# Patient Record
Sex: Male | Born: 1980 | Marital: Married | Smoking: Never smoker
Health system: Southern US, Community
[De-identification: ages and names within clinical notes are randomized; demographics above are authoritative.]

## PROBLEM LIST (undated history)

## (undated) HISTORY — PX: WISDOM TOOTH EXTRACTION: SHX21

---

## 2011-02-28 ENCOUNTER — Ambulatory Visit
Admission: RE | Admit: 2011-02-28 | Discharge: 2011-02-28 | Disposition: A | Discharge status: Home / Self Care | Payer: BC Managed Care – PPO | Source: Ambulatory Visit | Attending: Internal Medicine | Admitting: Internal Medicine

## 2011-02-28 ENCOUNTER — Other Ambulatory Visit: Payer: Self-pay | Admitting: Internal Medicine

## 2011-02-28 DIAGNOSIS — J019 Acute sinusitis, unspecified: Secondary | ICD-10-CM

## 2013-01-04 ENCOUNTER — Emergency Department (HOSPITAL_COMMUNITY)
Admission: EM | Admit: 2013-01-04 | Discharge: 2013-01-04 | Disposition: A | Discharge status: Home / Self Care | Payer: BC Managed Care – PPO | Source: Home / Self Care | Attending: Emergency Medicine | Admitting: Emergency Medicine

## 2013-01-04 ENCOUNTER — Encounter (HOSPITAL_COMMUNITY): Payer: Self-pay

## 2013-01-04 DIAGNOSIS — J019 Acute sinusitis, unspecified: Secondary | ICD-10-CM

## 2013-01-04 DIAGNOSIS — J069 Acute upper respiratory infection, unspecified: Secondary | ICD-10-CM

## 2013-01-04 MED ORDER — IBUPROFEN 800 MG PO TABS: 800.0000 mg | ORAL_TABLET | Freq: Once | ORAL | Status: DC

## 2013-01-04 MED ORDER — HYDROCODONE-ACETAMINOPHEN 5-325 MG PO TABS: 2.0000 | ORAL_TABLET | ORAL | Status: DC | PRN

## 2013-01-04 MED ORDER — AMOXICILLIN 500 MG PO CAPS: 1000.0000 mg | ORAL_CAPSULE | Freq: Three times a day (TID) | ORAL | Status: DC

## 2013-01-04 NOTE — ED Provider Notes (Signed)
Medical screening examination/treatment/procedure(s) were performed by non-physician practitioner and as supervising physician I was immediately available for consultation/collaboration.  Saliou Barnier, M.D.  Aanika Defoor C Deniz Hannan, MD 01/04/13 2041 

## 2013-01-04 NOTE — ED Provider Notes (Signed)
History     CSN: [id number]  Arrival date & time 01/04/13  1113   First MD Initiated Contact with Patient 01/04/13 1116      Chief Complaint  Patient presents with  . Generalized Body Aches    HPI: Patient is a 32 y.o. male presenting with URI. The history is provided by the patient.  URI Presenting symptoms: congestion, facial pain, fatigue, fever, rhinorrhea and sore throat   Presenting symptoms: no cough and no ear pain   Congestion:    Location:  Nasal   Interferes with sleep: yes   Fever:    Duration:  3 days   Timing:  Constant   Temp source:  Subjective   Progression:  Worsening Severity:  Mild Onset quality:  Gradual Duration:  3 days Timing:  Constant Relieved by:  Nothing Ineffective treatments:  None tried Associated symptoms: headaches, myalgias, sinus pain, sneezing and swollen glands   Associated symptoms: no neck pain and no wheezing   Pt reports onset of chills, subjective fever, and sorethroat Thursday. Since he has had severe sinus congestion, frontal area h/a's and body aches. States his nose stays full of secretions that are thick and white. Can not breathe through his nose at night.  History reviewed. No pertinent past medical history.  History reviewed. No pertinent past surgical history.  No family history on file.  History  Substance Use Topics  . Smoking status: Never Smoker   . Smokeless tobacco: Not on file  . Alcohol Use: No      Review of Systems  Constitutional: Positive for fever and fatigue.  HENT: Positive for congestion, sore throat, rhinorrhea, sneezing, postnasal drip and sinus pressure. Negative for ear pain, nosebleeds, facial swelling, neck pain and ear discharge.   Respiratory: Negative for cough and wheezing.   Cardiovascular: Negative for chest pain.  Gastrointestinal: Negative for nausea, vomiting and diarrhea.  Endocrine: Negative.   Genitourinary: Negative.   Musculoskeletal: Positive for myalgias.  Skin:  Negative.   Allergic/Immunologic: Negative.   Neurological: Positive for headaches.  Hematological: Negative.   Psychiatric/Behavioral: Negative.     Allergies  Review of patient's allergies indicates no known allergies.  Home Medications  No current outpatient prescriptions on file.  BP 98/58  Pulse 96  Temp(Src) 101.9 F (38.8 C) (Oral)  Resp 20  SpO2 100%  Physical Exam  Constitutional: He is oriented to person, place, and time. He appears well-developed and well-nourished.  HENT:  Head: Normocephalic and atraumatic.  Right Ear: Tympanic membrane, external ear and ear canal normal.  Left Ear: Tympanic membrane, external ear and ear canal normal.  Nose: Mucosal edema and rhinorrhea present. Right sinus exhibits frontal sinus tenderness. Right sinus exhibits no maxillary sinus tenderness. Left sinus exhibits frontal sinus tenderness. Left sinus exhibits no maxillary sinus tenderness.  Mouth/Throat: Uvula is midline and mucous membranes are normal. Posterior oropharyngeal erythema present. No oropharyngeal exudate, posterior oropharyngeal edema or tonsillar abscesses.  Purulent appearing exudate. Pharyngeal cobblestoning  Neck: Neck supple.  Cardiovascular: Normal rate and regular rhythm.   Pulmonary/Chest: Effort normal and breath sounds normal.  Musculoskeletal: Normal range of motion.  Lymphadenopathy:    He has cervical adenopathy.  Neurological: He is alert and oriented to person, place, and time.  Skin: Skin is warm and dry.    ED Course  Procedures (including critical care time)  Labs Reviewed - No data to display No results found.   No diagnosis found.    MDM  3 day h/o, chills,  fever and sore throat followed by severe sinus congestion, frontal h/a's and body aches. No cough. PE findings c/w sinusitis. Will treat w/ Amoxicillin x 10 days.         Leanne Chang, NP 01/04/13 1241

## 2013-01-04 NOTE — ED Notes (Signed)
C/o body aches and pains, fever, "stuffy nose" and headache. Patient states that the sx started Thursday

## 2014-12-23 ENCOUNTER — Ambulatory Visit: Payer: BC Managed Care – PPO | Attending: Internal Medicine | Admitting: Physical Therapy

## 2014-12-23 DIAGNOSIS — M6289 Other specified disorders of muscle: Secondary | ICD-10-CM

## 2014-12-23 DIAGNOSIS — M797 Fibromyalgia: Secondary | ICD-10-CM | POA: Diagnosis not present

## 2014-12-23 DIAGNOSIS — M7918 Myalgia, other site: Secondary | ICD-10-CM

## 2014-12-23 DIAGNOSIS — M629 Disorder of muscle, unspecified: Secondary | ICD-10-CM | POA: Diagnosis not present

## 2014-12-23 NOTE — Patient Instructions (Signed)
Hamstring: Towel Stretch (Supine)   Lie on back. Loop towel around left foot, hip and knee at 90. Straighten knee and pull foot toward body. Hold ___30 seconds. Relax. Repeat __2-3_ times. Do __2_ times a day. Repeat with other leg. Can also keep knee straight and loop sheet around arch of foot to stretch the entire posterior leg.     Copyright  VHI. All rights reserved.  HIP: Hamstrings - Short Sitting   Rest leg on raised surface. Keep knee straight. Lift chest. Hold __30_ seconds. __2-3_ reps per set, _2__ sets per day, __2-3_ days per week DO as an alternative to supine (on your back) when youre at work  [employer]. All rights reserved.  Outer Hip Stretch: Reclined IT Band Stretch (Strap)   Strap around opposite foot, pull across only as far as possible with shoulders on mat. Hold for _2-3___ breaths. Repeat ___2-3_ times each leg.  Copyright  VHI. All rights reserved.

## 2014-12-24 NOTE — Therapy (Signed)
[place], [place], [address] Phone: [phone]   Fax:  [phone]  Physical Therapy Evaluation  Patient Details  Name: Clinton Davis MRN: [id number] Date of Birth: [birth year] Referring Provider:  Irven Shelling, MD  Encounter Date: 12/23/2014      PT End of Session - 12/24/14 0806    Visit Number 1   Number of Visits 8   Date for PT Re-Evaluation 02/03/15   PT Start Time 1500   PT Stop Time 1555   PT Time Calculation (min) 55 min   Activity Tolerance Patient tolerated treatment well      No past medical history on file.  No past surgical history on file.  There were no vitals taken for this visit.  Visit Diagnosis:  Muscle pain, myofascial  Hamstring tightness of right lower extremity      Subjective Assessment - 12/23/14 1508    Symptoms This patient is an active male, plays soccer/cricket and presents with Rt. post leg pain which began 4 mos ago.   He c/o pain, tightness, cold in limbs at times. He says he does recall a specific injury.  Pain has increased over the last month, drives alot and thinks this contributes to his problem.  He cannot run. WIll play sport this spring.  Denies numbness, foot drop, weakness, low back pain.       Limitations Standing;Walking;Other (comment);Sitting  Sports, driving   How long can you sit comfortably? up to an hour   How long can you stand comfortably? as needed but has an awareness of discomfort with long periods.    How long can you walk comfortably? as needed but has an awareness of discomfort with long periods.    Diagnostic tests none   Patient Stated Goals to be able to play soccer this spring   Currently in Pain? Yes   Pain Score 6   6/10 max, at rest  1/10   Pain Orientation Right;Posterior   Pain Descriptors / Indicators Tightness;Sore   Pain Type Chronic pain   Pain Onset More than a month ago  4 mos    Pain Frequency Several days a  week   Aggravating Factors  driving, increased activity   Pain Relieving Factors changing positions, avoids stretching. Pt says he stretches regularly while in season.    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[place], [employer] educator   Leisure soccer, Dance movement psychotherapist   Overall Cognitive Status Within Functional Limits for tasks assessed   Posture/Postural Control   Posture/Postural Control No significant limitations   AROM   Right Hip Flexion 50  SLR   Left Hip Flexion 60  SLR   Lumbar Flexion WNL  with knees bent   Lumbar Extension WNL   Lumbar - Right Side Bend WNL   Lumbar - Left Side Bend WNL   Lumbar - Right Rotation WNL   Lumbar - Left Rotation WNL   PROM   Left Hip Flexion 60  Strength   Right Hip Flexion 5/5   Right Hip Extension 4+/5  pain knee post   Right Hip ABduction 5/5  glute med 4/5   Left Hip Flexion 5/5   Left Hip Extension 5/5   Left Hip ABduction 5/5   Right Knee Flexion 4+/5   Right Knee Extension 5/5   Left Knee Flexion 5/5   Left Knee Extension 5/5   Right Hip   Right Hip Flexion 60  pain   Palpation   Palpation sore to palpation length of Rt. medial hamstring, mostly in muscle belly                  [facility] Adult PT Treatment/Exercise - 12/23/14 1513    Knee/Hip Exercises: Stretches   Active Hamstring Stretch 3 reps;30 seconds   ITB Stretch 3 reps;30 seconds   Cryotherapy   Number Minutes Cryotherapy 12 Minutes   Cryotherapy Location --  hamstring   Type of Cryotherapy Ice pack   Electrical Stimulation   Electrical Stimulation Location Rt. hamstring   Electrical  Stimulation Action IFC   Electrical Stimulation Parameters to tolerance   Electrical Stimulation Goals Pain                PT Education - 12/24/14 [phone]    Education provided Yes   Education Details PT/POC, HEP for stretching, strain vs tear in mm, diff diagnosis   Person(s) Educated Patient   Methods Explanation;Demonstration;Handout   Comprehension Verbalized understanding;Returned demonstration             PT Long Term Goals - 12/24/14 2595    PT LONG TERM GOAL #1   Title Pt will be I with HEP for stretching and core strength   Time 6   Period Weeks   Status New   PT LONG TERM GOAL #2   Title Pt will be able to jog without increased pain    Time 6   Period Weeks   Status New   PT LONG TERM GOAL #3   Title Pt wil be able to sit for 1 hour to drive with no more than min pain    Time 6   Period Weeks   Status New   PT LONG TERM GOAL #4   Title Pt will be able to stand/walk and report no increased pain    Time 6   Period Weeks   Status New               Plan - 12/24/14 0807    Clinical Impression Statement This patient likely has hamstring strain that was not addressed when it initially happened.  He does have increased muscle tension, mild inhibition of strength due to pain.  He will benefit from gentle stretching, education and modailities to provide pain relief for patient to resume sports this spring.    Pt will benefit from skilled therapeutic intervention in order to improve on the following deficits Impaired flexibility;Increased fascial restricitons;Pain;Decreased strength;Decreased mobility   Rehab Potential Excellent   PT Frequency 2x / week  may only be able to do 1 time per week due to busy work schedule   PT Duration 6 weeks  total of 8 visits   PT Treatment/Interventions Moist Heat;Therapeutic activities;Patient/family education;Passive range of motion;Therapeutic exercise;Ultrasound;Manual techniques;Dry needling;Neuromuscular  re-education;Cryotherapy;Electrical Stimulation;Other (comment)  iontophoresis   PT Next Visit Plan stretch, manual, [place] vs IFC   PT Home Exercise Plan hamstring and ITB stretch   Consulted and Agree with Plan of Care  Patient         Problem List There are no active problems to display for this patient.   PAA,JENNIFER 12/24/2014, 8:31 AM  [facility] [address] [place], [place], [address] Phone: [phone]   Fax:  [phone]

## 2015-01-05 ENCOUNTER — Ambulatory Visit: Payer: BC Managed Care – PPO | Admitting: Physical Therapy

## 2015-01-05 DIAGNOSIS — M7918 Myalgia, other site: Secondary | ICD-10-CM

## 2015-01-05 DIAGNOSIS — M797 Fibromyalgia: Secondary | ICD-10-CM | POA: Diagnosis not present

## 2015-01-05 NOTE — Patient Instructions (Signed)
Warm up before stretching

## 2015-01-05 NOTE — Therapy (Signed)
[place], [place], [address] Phone: [phone]   Fax:  [phone]  Physical Therapy Treatment  Patient Details  Name: Clinton Davis MRN: [id number] Date of Birth: [birth year] Referring Provider:  Lavone Orn, MD  Encounter Date: 01/05/2015      PT End of Session - 01/05/15 0823    Visit Number 2   Number of Visits 8   Date for PT Re-Evaluation 02/03/15   PT Start Time 0735   PT Stop Time 0820   PT Time Calculation (min) 45 min   Activity Tolerance Patient tolerated treatment well      No past medical history on file.  No past surgical history on file.  There were no vitals filed for this visit.  Visit Diagnosis:  Muscle pain, myofascial      Subjective Assessment - 01/05/15 0822    Symptoms Tried to run and it hurt.  Needs another copy of his exercises.  Rt thigh posterior Gluteal to knee, lateral>Medial.  Also reported Rt scapular problem with turning head to LT.  Not really pain.                       [facility] Adult PT Treatment/Exercise - 01/05/15 0735    Knee/Hip Exercises: Stretches   Passive Hamstring Stretch --  re -issued   ITB Stretch --  reissued written instructions.  not practiced   Acupuncturist Location Rt hamstring   Electrical Stimulation Action IFC   Electrical Stimulation Parameters 12   Electrical Stimulation Goals Pain   Ultrasound   Ultrasound Location --  Posterior thigh RT proximal/distal   Ultrasound Parameters 100%, 1.5 watts/cm2   Ultrasound Goals Pain   Manual Therapy   Manual Therapy --  soft tissue work. tender distal , proximal central. No lumps                     PT Long Term Goals - 01/05/15 0827    PT LONG TERM GOAL #1   Title Pt will be I with HEP for stretching and core strength   Time 6   Period Weeks   Status On-going   PT LONG TERM GOAL #2   Title Pt will be able to jog without  increased pain    Time 6   Period Weeks   Status On-going   PT LONG TERM GOAL #3   Title Pt wil be able to sit for 1 hour to drive with no more than min pain    Time 6   Period Weeks   Status On-going   PT LONG TERM GOAL #4   Title Pt will be able to stand/walk and report no increased pain    Time 6   Period Weeks   Status On-going               Plan - 01/05/15 6546    Clinical Impression Statement  Could not find any muscle deviations with manual. Tension notes through out.  No new goals met.  Patient wanted IFC "It helped 2 days"   PT Next Visit Plan Stretch         Problem List There are no active problems to display for this patient. Melvenia Needles, PTA 01/05/2015 8:29 AM Phone: [phone] Fax: [phone]   [facility] 01/05/2015, 8:29 AM  [facility] [address] [place], [place], [address] Phone: [phone]   Fax:  [phone]      

## 2015-01-13 ENCOUNTER — Ambulatory Visit: Payer: BC Managed Care – PPO

## 2015-01-13 DIAGNOSIS — M6289 Other specified disorders of muscle: Secondary | ICD-10-CM

## 2015-01-13 DIAGNOSIS — M797 Fibromyalgia: Secondary | ICD-10-CM | POA: Diagnosis not present

## 2015-01-13 DIAGNOSIS — M7918 Myalgia, other site: Secondary | ICD-10-CM

## 2015-01-13 NOTE — Patient Instructions (Signed)
Asked to do partial sit up and hold with active SLR 3x5 reps 1-2x/day along with passive hamstring stretch

## 2015-01-13 NOTE — Therapy (Signed)
[place], [place], [address] Phone: [phone]   Fax:  [phone]  Physical Therapy Treatment  Patient Details  Name: Clinton Davis MRN: [id number] Date of Birth: [birth year] Referring Provider:  Lavone Orn, MD  Encounter Date: 01/13/2015      PT End of Session - 01/13/15 0753    Visit Number 3   Date for PT Re-Evaluation 02/03/15   PT Start Time 0705   PT Stop Time 0810   PT Time Calculation (min) 65 min   Activity Tolerance Patient tolerated treatment well   Behavior During Therapy [facility] for tasks assessed/performed      No past medical history on file.  No past surgical history on file.  There were no vitals filed for this visit.  Visit Diagnosis:  Hamstring tightness of right lower extremity  Muscle pain, myofascial      Subjective Assessment - 01/13/15 0710    Symptoms Annoying feeling (less like pain) in RT lateral hamstring from buttock to calf.  Stretches 1x/day 2x 30 sec.  Better by 40%.   He is able to drive more with less pain shifting.     Currently in Pain? Yes   Pain Score 4    Multiple Pain Sites No                       [facility] Adult PT Treatment/Exercise - 01/13/15 0715    Exercises   Exercises Knee/Hip   Knee/Hip Exercises: Stretches   Active Hamstring Stretch 3 reps;5 reps  1 set with pull down 1 set with head shoulder raise   Active Hamstring Stretch Limitations done with pull down and with head/shoulder and arm raise    Passive Hamstring Stretch 3 reps;30 seconds  REview of HEP   Knee/Hip Exercises: Aerobic   Stationary Bike L3 5 min   Manual Therapy   Manual Therapy Other (comment)   Other Manual Therapy MET for LT on Lt sacrum and anterior rotated RT ilia     RT active hamstring length improve 10 degrees after MET           PT Education - 01/13/15 0753    Education provided Yes   Education Details Active hamstring stretch   Person(s)  Educated Patient   Methods Explanation;Demonstration;Verbal cues   Comprehension Returned demonstration;Verbalized understanding             PT Long Term Goals - 01/05/15 0827    PT LONG TERM GOAL #1   Title Pt will be I with HEP for stretching and core strength   Time 6   Period Weeks   Status On-going   PT LONG TERM GOAL #2   Title Pt will be able to jog without increased pain    Time 6   Period Weeks   Status On-going   PT LONG TERM GOAL #3   Title Pt wil be able to sit for 1 hour to drive with no more than min pain    Time 6   Period Weeks   Status On-going   PT LONG TERM GOAL #4   Title Pt will be able to stand/walk and report no increased pain    Time 6   Period Weeks   Status On-going               Plan - 01/13/15 0754    Clinical Impression Statement Asymetry in pelvis may contribute to hamstring issues. Will assess pelvis  again and do MET if needed and how he felt post this sessioon.    PT Next Visit Plan Stretch, MET possible , modalities   Consulted and Agree with Plan of Care Patient        Problem List There are no active problems to display for this patient.   Darrel Hoover PT 01/13/2015, 7:56 AM  [facility] [address] [place], [place], [address] Phone: [phone]   Fax:  [phone]

## 2015-01-18 ENCOUNTER — Ambulatory Visit: Payer: BC Managed Care – PPO

## 2015-01-18 DIAGNOSIS — M797 Fibromyalgia: Secondary | ICD-10-CM | POA: Diagnosis not present

## 2015-01-18 DIAGNOSIS — M6289 Other specified disorders of muscle: Secondary | ICD-10-CM

## 2015-01-18 DIAGNOSIS — M7918 Myalgia, other site: Secondary | ICD-10-CM

## 2015-01-18 NOTE — Therapy (Signed)
[place], [place], [address] Phone: [phone]   Fax:  [phone]  Physical Therapy Treatment  Patient Details  Name: Clinton Davis MRN: [id number] Date of Birth: May 15, 1981 Referring Provider:  Lavone Orn, MD  Encounter Date: 01/18/2015      PT End of Session - 01/18/15 0755    Visit Number 4   Number of Visits 8   Date for PT Re-Evaluation 02/03/15   PT Start Time 0700   PT Stop Time 0805   PT Time Calculation (min) 65 min   Activity Tolerance Patient tolerated treatment well;Patient limited by pain   Behavior During Therapy [facility] for tasks assessed/performed      No past medical history on file.  No past surgical history on file.  There were no vitals filed for this visit.  Visit Diagnosis:  Hamstring tightness of right lower extremity  Muscle pain, myofascial      Subjective Assessment - 01/18/15 0718    Symptoms Posterior RT knee pain today. Less pain in thigh and close to buttock.   Currently in Pain? Yes   Pain Score 6    Pain Location Knee   Pain Orientation Right;Posterior   Pain Descriptors / Indicators Aching   Multiple Pain Sites No            [facility] PT Assessment - 01/18/15 0721    AROM   Right/Left Hip --  SLR RT 2 inches > Lt    Palpation   Palpation all pelvic landmarks and leg length appear level,                    [facility] Adult PT Treatment/Exercise - 01/18/15 0719    Knee/Hip Exercises: Stretches   Active Hamstring Stretch 3 reps;5 reps   Active Hamstring Stretch Limitations done with pull down and with head/shoulder and arm raise . Also done sitting with opposite leg leg lift 3 x 5 reps with decreased throb in RT posterior thigh (throb was from SLR passivex 1)    Knee/Hip Exercises: Aerobic   Stationary Bike L3 8 min   Knee/Hip Exercises: Prone   Hamstring Curl 2 sets;10 reps   Hamstring Curl Limitations one set no weight , one set 3 pounds    Straight Leg Raises Strengthening;Right;1 set;15 reps   Straight Leg Raises Limitations 3 pounds    Modalities   Modalities Moist Heat   Moist Heat Therapy   Number Minutes Moist Heat 20 Minutes   Moist Heat Location --  posterior RT thigh   Electrical Stimulation   Electrical Stimulation Location Rt hamstring   Electrical Stimulation Action IFC   Electrical Stimulation Parameters L12   Electrical Stimulation Goals Pain      Prone press ups and full lumbar flexion did not change symptoms today          PT Education - 01/18/15 0755    Education provided Yes   Education Details reviewed what was asssessed today andPOC   Person(s) Educated Patient   Methods Explanation   Comprehension Verbalized understanding             PT Long Term Goals - 01/05/15 0827    PT LONG TERM GOAL #1   Title Pt will be I with HEP for stretching and core strength   Time 6   Period Weeks   Status On-going   PT LONG TERM GOAL #2   Title Pt will be able to jog without increased  pain    Time 6   Period Weeks   Status On-going   PT LONG TERM GOAL #3   Title Pt wil be able to sit for 1 hour to drive with no more than min pain    Time 6   Period Weeks   Status On-going   PT LONG TERM GOAL #4   Title Pt will be able to stand/walk and report no increased pain    Time 6   Period Weeks   Status On-going               Plan - 01/18/15 0755    Clinical Impression Statement No asymetry  now and no back motions increased or decreaseed pain. Will start strength and will start manual to postreior RT thigh with kineseotape STW and modalities   PT Next Visit Plan Assess pelvis , ham curls and hip extension strength, SLR with LT leg sitting , Posssible total motion assesment. STW modalities   Consulted and Agree with Plan of Care Patient        Problem List There are no active problems to display for this patient.   Darrel Hoover PT 01/18/2015, 7:59 AM  [facility] [address] [place], [place], [address] Phone: [phone]   Fax:  [phone]

## 2015-01-27 ENCOUNTER — Ambulatory Visit: Payer: BC Managed Care – PPO | Attending: Internal Medicine

## 2015-01-27 DIAGNOSIS — M6289 Other specified disorders of muscle: Secondary | ICD-10-CM

## 2015-01-27 DIAGNOSIS — M797 Fibromyalgia: Secondary | ICD-10-CM | POA: Diagnosis not present

## 2015-01-27 DIAGNOSIS — M7918 Myalgia, other site: Secondary | ICD-10-CM

## 2015-01-27 DIAGNOSIS — M629 Disorder of muscle, unspecified: Secondary | ICD-10-CM | POA: Diagnosis not present

## 2015-01-27 NOTE — Therapy (Signed)
[place], [place], [address] Phone: [phone]   Fax:  [phone]  Physical Therapy Treatment  Patient Details  Name: Theodus Ran MRN: [id number] Date of Birth: June 06, 1981 Referring Provider:  Lavone Orn, MD  Encounter Date: 01/27/2015      PT End of Session - 01/27/15 0747    PT Start Time 0710   PT Stop Time 0810   PT Time Calculation (min) 60 min   Activity Tolerance Patient tolerated treatment well   Behavior During Therapy [facility] for tasks assessed/performed      No past medical history on file.  No past surgical history on file.  There were no vitals filed for this visit.  Visit Diagnosis:  Hamstring tightness of right lower extremity - Plan: PT plan of care cert/re-cert  Muscle pain, myofascial - Plan: PT plan of care cert/re-cert      Subjective Assessment - 01/27/15 0711    Subjective 4/10 posterior knee pain.  Ran across road with pain but less than before.    Currently in Pain? Yes   Pain Score 4    Multiple Pain Sites No                       [facility] Adult PT Treatment/Exercise - 01/27/15 0713    Knee/Hip Exercises: Stretches   Active Hamstring Stretch 3 reps;5 reps   Active Hamstring Stretch Limitations done with pull down and with head/shoulder and arm raise . Also done sitting with opposite leg leg lift 3 x 5 reps with decreased throb in RT posterior thigh (throb was from SLR passivex 1)    Passive Hamstring Stretch 3 reps;30 seconds   Knee/Hip Exercises: Aerobic   Stationary Bike L# 6 min   Knee/Hip Exercises: Prone   Hamstring Curl 10 reps;2 sets  60 sec stretch after curl   Hamstring Curl Limitations 5 pounds   Moist Heat Therapy   Number Minutes Moist Heat 20 Minutes   Moist Heat Location --  hamstring   Electrical Stimulation   Electrical Stimulation Location Rt hamstring   Electrical Stimulation Action IFC   Electrical Stimulation Parameters L18   Electrical Stimulation Goals Pain   Ultrasound   Ultrasound Location RT posterior knee /hamstring medial   Ultrasound Parameters 100% 1MHz, 1.6Wcm2   Ultrasound Goals Pain                     PT Long Term Goals - 01/27/15 0753    PT LONG TERM GOAL #1   Title Pt will be I with HEP for stretching and core strength   Status On-going   PT LONG TERM GOAL #2   Title Pt will be able to jog without increased pain    Status On-going   PT LONG TERM GOAL #3   Title Pt wil be able to sit for 1 hour to drive with no more than min pain    Status On-going   PT LONG TERM GOAL #4   Title Pt will be able to stand/walk and report no increased pain    Status On-going               Plan - 01/27/15 0748    Clinical Impression Statement PAin level less . Still 5-10 degrees decr SLR on RT, Slow progress. Have asked to have him schedled with other PT for dry needling   PT Next Visit Plan Continue hamstring curls. strething, modalities , ?  STW,    Consulted and Agree with Plan of Care Patient        Problem List There are no active problems to display for this patient.   Darrel Hoover PT 01/27/2015, 8:05 AM  [facility] [address] [place], [place], [address] Phone: [phone]   Fax:  [phone]

## 2015-02-02 ENCOUNTER — Ambulatory Visit: Payer: BC Managed Care – PPO | Admitting: Physical Therapy

## 2015-02-02 DIAGNOSIS — M797 Fibromyalgia: Secondary | ICD-10-CM | POA: Diagnosis not present

## 2015-02-02 DIAGNOSIS — M7918 Myalgia, other site: Secondary | ICD-10-CM

## 2015-02-02 DIAGNOSIS — M6289 Other specified disorders of muscle: Secondary | ICD-10-CM

## 2015-02-02 NOTE — Therapy (Signed)
[place], [place], [address] Phone: [phone]   Fax:  [phone]  Physical Therapy Treatment  Patient Details  Name: Clinton Davis MRN: [id number] Date of Birth: 12/02/1980 Referring Provider:  Lavone Orn, MD  Encounter Date: 02/02/2015      PT End of Session - 02/02/15 0735    Visit Number 6   Number of Visits 16   Date for PT Re-Evaluation 02/24/15   PT Start Time 0730   PT Stop Time 0800   PT Time Calculation (min) 30 min      No past medical history on file.  No past surgical history on file.  There were no vitals filed for this visit.  Visit Diagnosis:  Hamstring tightness of right lower extremity  Muscle pain, myofascial      Subjective Assessment - 02/02/15 0840    Subjective It's still there   Currently in Pain? Yes   Pain Score 3    Pain Location Knee   Pain Orientation Right;Posterior   Aggravating Factors  running   Pain Relieving Factors modalities                       [facility] Adult PT Treatment/Exercise - 02/02/15 0001    Knee/Hip Exercises: Aerobic   Stationary Bike L3 x 5 min   Ultrasound   Ultrasound Location Rt posterior knee and right proximal medial hamstring   Ultrasound Parameters 100% 1.3 w/cm2   Ultrasound Goals Pain   Manual Therapy   Manual Therapy Massage   Massage STW proximal to distal hamstring.                      PT Long Term Goals - 01/27/15 0753    PT LONG TERM GOAL #1   Title Pt will be I with HEP for stretching and core strength   Status On-going   PT LONG TERM GOAL #2   Title Pt will be able to jog without increased pain    Status On-going   PT LONG TERM GOAL #3   Title Pt wil be able to sit for 1 hour to drive with no more than min pain    Status On-going   PT LONG TERM GOAL #4   Title Pt will be able to stand/walk and report no increased pain    Status On-going               Plan - 02/02/15  0847    Clinical Impression Statement Pt reports frustration with not returning to [employer] and contiued pain in hamstring after attempts at running. Primary PT recommends dry needling trial. No change in pain after manual and ultrasound today.    PT Next Visit Plan Continue hamstring curls. strething, modalities , Dry needing        Problem List There are no active problems to display for this patient.   Dorene Ar, [place] 02/02/2015, 8:49 AM  [place], [place], [address] Phone: [phone]   Fax:  [phone]

## 2015-02-08 ENCOUNTER — Ambulatory Visit: Payer: BC Managed Care – PPO | Admitting: Physical Therapy

## 2015-02-08 DIAGNOSIS — M6289 Other specified disorders of muscle: Secondary | ICD-10-CM

## 2015-02-08 DIAGNOSIS — M7918 Myalgia, other site: Secondary | ICD-10-CM

## 2015-02-08 DIAGNOSIS — M797 Fibromyalgia: Secondary | ICD-10-CM | POA: Diagnosis not present

## 2015-02-08 NOTE — Therapy (Signed)
[place], [place], [address] Phone: [phone]   Fax:  [phone]  Physical Therapy Treatment  Patient Details  Name: Clinton Davis MRN: [id number] Date of Birth: [birth year] Referring Provider:  Lavone Orn, MD  Encounter Date: 02/08/2015      PT End of Session - 02/08/15 1656    Visit Number 7   Number of Visits 16   Date for PT Re-Evaluation 02/24/15   PT Start Time 5462   PT Stop Time 1638   PT Time Calculation (min) 53 min   Activity Tolerance Patient tolerated treatment well      No past medical history on file.  No past surgical history on file.  There were no vitals filed for this visit.  Visit Diagnosis:  Hamstring tightness of right lower extremity  Muscle pain, myofascial      Subjective Assessment - 02/08/15 1537    Subjective (p) Much improved, the massage, the U/S helped, e-stim helped but I still feel it;  ran a little not far but it is better than a few weeks;  no 1 mile yet;     Currently in Pain? (p) No/denies   Pain Orientation (p) Right;Posterior;Mid;Distal   Pain Type (p) Chronic pain   Aggravating Factors  (p) running; sitting too long                         [facility] Adult PT Treatment/Exercise - 02/08/15 1624    Exercises   Exercises Knee/Hip   Knee/Hip Exercises: Seated   Other Seated Knee Exercises Level 1 agility and core strengthening progressing see patient instructions   Moist Heat Therapy   Number Minutes Moist Heat 12 Minutes   Moist Heat Location --  right posterior thigh (prone)   Manual Therapy   Manual Therapy Myofascial release   Myofascial Release right HS          Trigger Point Dry Needling - 02/08/15 1626    Muscles Treated Lower Body Hamstring   Hamstring Response Twitch response elicited;Palpable increased muscle length  biceps and semitendinosis       Right only.       PT Education - 02/08/15 1655    Education  provided Yes   Education Details dry needling handout; HS agility and core strengthening   Person(s) Educated Patient   Methods Explanation;Demonstration;Handout   Comprehension Verbalized understanding;Returned demonstration             PT Long Term Goals - 02/08/15 1658    PT LONG TERM GOAL #1   Title Pt will be I with HEP for stretching and core strength   Time 6   Period Weeks   Status On-going   PT LONG TERM GOAL #2   Title Pt will be able to jog without increased pain    Time 6   Period Weeks   Status On-going   PT LONG TERM GOAL #3   Title Pt wil be able to sit for 1 hour to drive with no more than min pain    Time 6   Period Weeks   Status On-going   PT LONG TERM GOAL #4   Title Pt will be able to stand/walk and report no increased pain    Time 6   Period Weeks   Status On-going               Plan - 02/08/15 1704    Clinical Impression  Statement Patient with multiple trigger points right biceps femoris and semitendinosis muscles.  Patient had multiple muscle twitches with dry needling and improved muscle length following manual therapy.  Therapist closely monitoring response throughout.  Patient receptive to dynamic HS agility and core strengthening HEP.     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   Alvera Singh 02/08/2015, 5:10 PM  [facility] [address] [place], [place], [address] Phone: [phone]   Fax:  [phone]   Ruben Im, PT 02/08/2015 5:10 PM Phone: [phone] Fax: [phone]

## 2015-02-08 NOTE — Patient Instructions (Signed)
HEP:  Sidestepping 3x 12min; grapevine 3x 1 min; planks 3x 20 sec; side planks 3x 20 sec; bridging; long sitting with cold pack; Use of heat following dry needling  Trigger Point Dry Needling  . What is Trigger Point Dry Needling (DN)? o DN is a physical therapy technique used to treat muscle pain and dysfunction. Specifically, DN helps deactivate muscle trigger points (muscle knots).  o A thin filiform needle is used to penetrate the skin and stimulate the underlying trigger point. The goal is for a local twitch response (LTR) to occur and for the trigger point to relax. No medication of any kind is injected during the procedure.   . What Does Trigger Point Dry Needling Feel Like?  o The procedure feels different for each individual patient. Some patients report that they do not actually feel the needle enter the skin and overall the process is not painful. Very mild bleeding may occur. However, many patients feel a deep cramping in the muscle in which the needle was inserted. This is the local twitch response.   Marland Kitchen How Will I feel after the treatment? o Soreness is normal, and the onset of soreness may not occur for a few hours. Typically this soreness does not last longer than two days.  o Bruising is uncommon, however; ice can be used to decrease any possible bruising.  o In rare cases feeling tired or nauseous after the treatment is normal. In addition, your symptoms may get worse before they get better, this period will typically not last longer than 24 hours.   . What Can I do After My Treatment? o Increase your hydration by drinking more water for the next 24 hours. o You may place ice or heat on the areas treated that have become sore, however, do not use heat on inflamed or bruised areas. Heat often brings more relief post needling. o You can continue your regular activities, but vigorous activity is not recommended initially after the treatment for 24 hours. o DN is best combined with  other physical therapy such as strengthening, stretching, and other therapies.

## 2015-02-15 ENCOUNTER — Ambulatory Visit: Payer: BC Managed Care – PPO | Admitting: Physical Therapy

## 2015-02-25 ENCOUNTER — Ambulatory Visit: Payer: BC Managed Care – PPO | Attending: Internal Medicine | Admitting: Physical Therapy

## 2015-02-25 DIAGNOSIS — M797 Fibromyalgia: Secondary | ICD-10-CM | POA: Diagnosis not present

## 2015-02-25 DIAGNOSIS — M7918 Myalgia, other site: Secondary | ICD-10-CM

## 2015-02-25 DIAGNOSIS — M629 Disorder of muscle, unspecified: Secondary | ICD-10-CM | POA: Insufficient documentation

## 2015-02-25 DIAGNOSIS — M6289 Other specified disorders of muscle: Secondary | ICD-10-CM

## 2015-02-25 NOTE — Therapy (Addendum)
[place], [place], [address] Phone: [phone]   Fax:  [phone]  Physical Therapy Treatment/Recertification/Discharge Summary  Patient Details  Name: Clinton Davis MRN: [id number] Date of Birth: October 02, 1981 Referring Provider:  Lavone Orn, MD  Encounter Date: 02/25/2015      PT End of Session - 02/25/15 1135    Visit Number 8   Number of Visits 16   Date for PT Re-Evaluation 03/25/15   PT Start Time 1025   PT Stop Time 1130   PT Time Calculation (min) 65 min   Activity Tolerance Patient tolerated treatment well      No past medical history on file.  No past surgical history on file.  There were no vitals filed for this visit.  Visit Diagnosis:  Hamstring tightness of right lower extremity - Plan: PT plan of care cert/re-cert  Muscle pain, myofascial - Plan: PT plan of care cert/re-cert      Subjective Assessment - 02/25/15 1025    Subjective Patient states he plans to see the doctor regarding imaging.  Patient states I still feel it but did do a few jogs.  Pulsed a lot when I sat down afterwards.  Reports minimal compliance with exercise  because of busy schedule.  Unsure of the benefit of dry needling.     Currently in Pain? No/denies   Pain Location Leg   Pain Orientation Right;Posterior   Pain Type Chronic pain   Aggravating Factors  driving with right leg extended            [facility] PT Assessment - 02/25/15 1122    ROM / Strength   AROM / PROM / Strength AROM;PROM   AROM   Right/Left Hip Right   Right Hip Extension 10   Strength   Right Hip Flexion 5/5   Right Hip Extension 5/5   Right Hip ABduction 4+/5                     [facility] Adult PT Treatment/Exercise - 02/25/15 1131    Knee/Hip Exercises: Supine   Other Supine Knee Exercises hip flexor stretch over side of table 3x 20 sec   Other Supine Knee Exercises verbal instruction of gluteus medius "best  exercises"   Knee/Hip Exercises: Sidelying   Other Sidelying Knee Exercises right hip abduction 10x   Moist Heat Therapy   Number Minutes Moist Heat 12 Minutes   Moist Heat Location --  right HS in prone   Manual Therapy   Manual Therapy Myofascial release;Joint mobilization   Joint Mobilization Hip mobs grade 3/4 distraction, inferior, A-P in IR,PA in ER and IR 3x 20 sec   Massage STW proximal to distal hamstring.    Myofascial Release instrument assisted Soft tissue mob HS   Other Manual Therapy sciatic neural glides 20x          Trigger Point Dry Needling - 02/25/15 1134    Consent Given? Yes   Muscles Treated Lower Body Hamstring   Hamstring Response Twitch response elicited;Palpable increased muscle length              PT Education - 02/25/15 1121    Education provided Yes   Education Details hip flexor stretching; gluteal med strengthening handout; gave blue band; self care following needling   Person(s) Educated Patient   Methods Explanation;Demonstration   Comprehension Verbalized understanding;Returned demonstration             PT Long Term Goals -  02/25/15 1030    PT LONG TERM GOAL #1   Title (p) Pt will be I with HEP for stretching and core strength   Time (p) 6   Period (p) Weeks   Status (p) On-going   PT LONG TERM GOAL #2   Title (p) Pt will be able to jog without increased pain    Time (p) 6   Status (p) Partially Met   PT LONG TERM GOAL #3   Title (p) Pt wil be able to sit for 1 hour to drive with no more than min pain    Time (p) 6   Period (p) Weeks   Status (p) Achieved   PT LONG TERM GOAL #4   Title (p) Pt will be able to stand/walk and report no increased pain    Status (p) Achieved               Plan - 02/25/15 1135    Clinical Impression Statement Patient self reports minimal compliance with HEP.  He reports he is better but still with HS pain following attempts to jog and with driving for 3 hours.  He reports no major  changes following 1 session of dry needling but lack of compliance with home exercise over the past 2 1/2 weeks may have contributed to lack of benefit.  Decreased hip flexor muscle length right > left.  HS length on right 45 degrees.   Decreased right hip abd strength 4+/5.  2nd time dry needling today and patient was instructed in additional exercises to address glut med weakness and hip flexor length.  He would like to hold PT to discuss with his doctor his current status.  Patient agrees that if he has not called to schedule additional appts by the end of the month will discharge from PT.   PT Next Visit Plan Patient did not wish to schedule appts at this time.  Will recertify for 4 weeks.         PHYSICAL THERAPY DISCHARGE SUMMARY  Visits from Start of Care: 8  Current functional level related to goals / functional outcomes: See clinical impressions above .  He did not call/return after last appointment and his chart has been inactive for several months.     Remaining deficits: Partial goals met   Education / Equipment: HEP Plan: Patient agrees to discharge.  Patient goals were partially met. Patient is being discharged due to not returning since the last visit.  ?????        Problem List There are no active problems to display for this patient.  Ruben Im, PT 07/21/2015 8:19 AM Phone: [phone] Fax: [phone]  Alvera Singh 02/25/2015, 11:49 AM  [place], [place], [address] Phone: [phone]   Fax:  [phone]

## 2017-07-10 ENCOUNTER — Other Ambulatory Visit (HOSPITAL_BASED_OUTPATIENT_CLINIC_OR_DEPARTMENT_OTHER): Payer: Self-pay

## 2017-07-10 DIAGNOSIS — G4730 Sleep apnea, unspecified: Secondary | ICD-10-CM

## 2017-07-10 DIAGNOSIS — G4710 Hypersomnia, unspecified: Secondary | ICD-10-CM

## 2017-07-13 ENCOUNTER — Ambulatory Visit (HOSPITAL_COMMUNITY)
Admission: EM | Admit: 2017-07-13 | Discharge: 2017-07-13 | Disposition: A | Discharge status: Home / Self Care | Payer: BC Managed Care – PPO | Attending: Family Medicine | Admitting: Family Medicine

## 2017-07-13 ENCOUNTER — Encounter (HOSPITAL_COMMUNITY): Payer: Self-pay | Admitting: Family Medicine

## 2017-07-13 DIAGNOSIS — T161XXA Foreign body in right ear, initial encounter: Secondary | ICD-10-CM

## 2017-07-13 NOTE — ED Triage Notes (Signed)
Pt here for q tip end in right ear and right upper back pain.

## 2017-07-15 NOTE — ED Provider Notes (Signed)
  [place]   [id number] 07/13/17 Arrival Time: 2482  ASSESSMENT & PLAN:  1. Foreign body of right ear, initial encounter    Cotton removed without complication using alligator forceps. Reviewed expectations re: course of current medical issues. Questions answered. Outlined signs and symptoms indicating need for more acute intervention. Patient verbalized understanding. After Visit Summary given.   SUBJECTIVE:  Micai Apolinar is a 36 y.o. male who presents with complaint of foreign body in his R ear. Cotton from Q-tip stuck. No pain. Happened today. Unable to remove.  ROS: As per HPI.   OBJECTIVE:  Vitals:   07/13/17 1807  BP: 105/64  Pulse: 64  Resp: 18  Temp: 98.1 F (36.7 C)  SpO2: 99%    General appearance: alert; no distress HENT: R EAC with cotton present; TM normal Neck: supple Psychological: alert and cooperative; normal mood and affect  No Known Allergies   Social History   Social History  . Marital status: Married    Spouse name: N/A  . Number of children: N/A  . Years of education: N/A   Occupational History  . Not on file.   Social History Main Topics  . Smoking status: Never Smoker  . Smokeless tobacco: Not on file  . Alcohol use No  . Drug use: No  . Sexual activity: Not on file   Other Topics Concern  . Not on file   Social History Narrative  . No narrative on file      Vanessa Kick, MD 07/15/17 [phone]

## 2017-08-02 ENCOUNTER — Ambulatory Visit (HOSPITAL_BASED_OUTPATIENT_CLINIC_OR_DEPARTMENT_OTHER): Payer: BC Managed Care – PPO | Attending: Internal Medicine | Admitting: Internal Medicine

## 2017-08-02 VITALS — Ht 67.0 in | Wt 208.0 lb

## 2017-08-02 DIAGNOSIS — R5383 Other fatigue: Secondary | ICD-10-CM | POA: Insufficient documentation

## 2017-08-02 DIAGNOSIS — G4710 Hypersomnia, unspecified: Secondary | ICD-10-CM

## 2017-08-02 DIAGNOSIS — R0683 Snoring: Secondary | ICD-10-CM

## 2017-08-02 DIAGNOSIS — R0681 Apnea, not elsewhere classified: Secondary | ICD-10-CM | POA: Diagnosis present

## 2017-08-02 DIAGNOSIS — G4730 Sleep apnea, unspecified: Secondary | ICD-10-CM

## 2017-08-07 NOTE — Procedures (Signed)
   NAME: Clinton Davis DATE OF BIRTH:  12-Oct-1981 MEDICAL RECORD NUMBER [id number]  LOCATION: [facility]  PHYSICIAN: Marius Ditch  DATE OF STUDY: 08/02/2017  SLEEP STUDY TYPE: Nocturnal Polysomnogram               REFERRING PHYSICIAN: Marius Ditch, MD  INDICATION FOR STUDY: witnessed apnea, loud snoring, excessive sleepiness  EPWORTH SLEEPINESS SCORE:   HEIGHT: 5\' 7"  (170.2 cm)  WEIGHT: 208 lb (94.3 kg)    Body mass index is 32.58 kg/m.  NECK SIZE:   in.  MEDICATIONS Patient self administered medications include: N/A. Medications administered during study include No sleep medicine administered. I have reviewed the patient's medications in eCW.   SLEEP STUDY TECHNIQUE A multi-channel overnight Polysomnography study was performed. The channels recorded and monitored were central and occipital EEG, electrooculogram (EOG), submentalis EMG (chin), nasal and oral airflow, thoracic and abdominal wall motion, anterior tibialis EMG, snore microphone, electrocardiogram, and a pulse oximetry.  TECHNICAL COMMENTS Comments added by Technician: NONE  Comments added by Scorer: N/A  SLEEP ARCHITECTURE The study was initiated at 10:24:37 PM and terminated at 5:02:44 AM. The total recorded time was 398.1 minutes. EEG confirmed total sleep time was 363.0 minutes yielding a sleep efficiency of 91.2%. Sleep onset after lights out was 6.3 minutes with a REM latency of 62.5 minutes. The patient spent 5.65% of the night in stage N1 sleep, 67.08% in stage N2 sleep, 0.00% in stage N3 and 27.27% in REM. Wake after sleep onset (WASO) was 28.8 minutes. The Arousal Index was 19.5/hour.  RESPIRATORY PARAMETERS There were a total of 22 respiratory disturbances out of which 2 were apneas ( 2 obstructive, 0 mixed, 0 central) and 20 hypopneas. The apnea/hypopnea index (AHI) was 3.6 events/hour. The central sleep apnea index was 0.0 events/hour. The REM AHI was 7.9 events/hour and NREM AHI  was 2.0 events/hour. The supine AHI was 3.4 events/hour and the non supine AHI was 3.78 supine during 43.15% of sleep. RDI is 7.9/hr and REM RDI is 17/hr. Respiratory disturbances were associated with oxygen desaturation down to a nadir of 86.00% during sleep. The mean oxygen saturation during the study was 93.27%. The cumulative time under 88% oxygen saturation was 0.9 minutes.  LEG MOVEMENT DATA The total leg movements were with a resulting leg movement index of 4/hr . Associated arousal with leg movement index was 2.1/hr.  CARDIAC DATA The underlying cardiac rhythm was most consistent with sinus rhythm. Mean heart rate during sleep was 60.19 bpm. Additional rhythm abnormalities include None.  IMPRESSIONS - There is no evidence of significant sleep disordered breathing. - Sleep disordered breathing is present but is in the normal range by AHI. His RDI is slightly above normal. In the absence of an elevated AHI, no treatment is needed.   DIAGNOSIS - Hypersomnia, unspecified (G47.10)  RECOMMENDATIONS - There is no indication for the treatment of sleep disordered breathing  Marius Ditch Sleep specialist, American Board of Sleep Medicine  ELECTRONICALLY SIGNED ON:  08/07/2017, 8:45 PM [place] PH: [phone]   FX: [phone] [place]

## 2018-08-13 ENCOUNTER — Ambulatory Visit
Admission: RE | Admit: 2018-08-13 | Discharge: 2018-08-13 | Disposition: A | Discharge status: Home / Self Care | Payer: BC Managed Care – PPO | Source: Ambulatory Visit | Attending: Internal Medicine | Admitting: Internal Medicine

## 2018-08-13 ENCOUNTER — Other Ambulatory Visit: Payer: Self-pay | Admitting: Internal Medicine

## 2018-08-13 DIAGNOSIS — R0781 Pleurodynia: Secondary | ICD-10-CM

## 2019-05-13 ENCOUNTER — Other Ambulatory Visit: Payer: Self-pay

## 2019-05-13 DIAGNOSIS — Z20822 Contact with and (suspected) exposure to covid-19: Secondary | ICD-10-CM

## 2019-05-15 LAB — NOVEL CORONAVIRUS, NAA: SARS-CoV-2, NAA: NOT DETECTED

## 2019-05-15 LAB — SPECIMEN STATUS REPORT

## 2019-06-08 ENCOUNTER — Ambulatory Visit
Admission: RE | Admit: 2019-06-08 | Discharge: 2019-06-08 | Disposition: A | Discharge status: Home / Self Care | Payer: BC Managed Care – PPO | Source: Ambulatory Visit | Attending: Internal Medicine | Admitting: Internal Medicine

## 2019-06-08 ENCOUNTER — Other Ambulatory Visit: Payer: Self-pay | Admitting: Internal Medicine

## 2019-06-08 DIAGNOSIS — R634 Abnormal weight loss: Secondary | ICD-10-CM

## 2019-06-10 ENCOUNTER — Other Ambulatory Visit: Payer: Self-pay | Admitting: Internal Medicine

## 2019-06-10 ENCOUNTER — Other Ambulatory Visit: Payer: BC Managed Care – PPO

## 2019-06-10 DIAGNOSIS — R634 Abnormal weight loss: Secondary | ICD-10-CM

## 2019-06-10 DIAGNOSIS — R509 Fever, unspecified: Secondary | ICD-10-CM

## 2019-06-10 DIAGNOSIS — R61 Generalized hyperhidrosis: Secondary | ICD-10-CM

## 2019-06-10 DIAGNOSIS — D509 Iron deficiency anemia, unspecified: Secondary | ICD-10-CM

## 2019-06-10 DIAGNOSIS — D72829 Elevated white blood cell count, unspecified: Secondary | ICD-10-CM

## 2019-06-11 ENCOUNTER — Other Ambulatory Visit: Payer: Self-pay | Admitting: Internal Medicine

## 2019-06-11 ENCOUNTER — Ambulatory Visit
Admission: RE | Admit: 2019-06-11 | Discharge: 2019-06-11 | Disposition: A | Discharge status: Home / Self Care | Payer: BC Managed Care – PPO | Source: Ambulatory Visit | Attending: Internal Medicine | Admitting: Internal Medicine

## 2019-06-11 ENCOUNTER — Other Ambulatory Visit (HOSPITAL_COMMUNITY): Payer: Self-pay | Admitting: Internal Medicine

## 2019-06-11 DIAGNOSIS — D72829 Elevated white blood cell count, unspecified: Secondary | ICD-10-CM

## 2019-06-11 DIAGNOSIS — R61 Generalized hyperhidrosis: Secondary | ICD-10-CM

## 2019-06-11 DIAGNOSIS — D509 Iron deficiency anemia, unspecified: Secondary | ICD-10-CM

## 2019-06-11 DIAGNOSIS — R634 Abnormal weight loss: Secondary | ICD-10-CM

## 2019-06-11 DIAGNOSIS — R509 Fever, unspecified: Secondary | ICD-10-CM

## 2019-06-11 DIAGNOSIS — R9389 Abnormal findings on diagnostic imaging of other specified body structures: Secondary | ICD-10-CM

## 2019-06-11 MED ORDER — IOPAMIDOL (ISOVUE-300) INJECTION 61%
100.0000 mL | Freq: Once | INTRAVENOUS | Status: AC | PRN
  Administered 2019-06-11: 12:00:00 100 mL via INTRAVENOUS

## 2019-07-14 ENCOUNTER — Other Ambulatory Visit: Payer: Self-pay

## 2019-07-14 DIAGNOSIS — Z20822 Contact with and (suspected) exposure to covid-19: Secondary | ICD-10-CM

## 2019-07-15 LAB — NOVEL CORONAVIRUS, NAA: SARS-CoV-2, NAA: NOT DETECTED

## 2019-07-24 ENCOUNTER — Other Ambulatory Visit: Payer: BC Managed Care – PPO

## 2019-07-24 ENCOUNTER — Other Ambulatory Visit: Payer: Self-pay | Admitting: Internal Medicine

## 2019-07-24 ENCOUNTER — Inpatient Hospital Stay: Admission: RE | Admit: 2019-07-24 | Payer: BC Managed Care – PPO | Source: Ambulatory Visit

## 2019-07-24 DIAGNOSIS — R059 Cough, unspecified: Secondary | ICD-10-CM

## 2019-07-24 DIAGNOSIS — D649 Anemia, unspecified: Secondary | ICD-10-CM

## 2019-07-24 DIAGNOSIS — R05 Cough: Secondary | ICD-10-CM

## 2019-07-28 ENCOUNTER — Ambulatory Visit
Admission: RE | Admit: 2019-07-28 | Discharge: 2019-07-28 | Disposition: A | Discharge status: Home / Self Care | Payer: BC Managed Care – PPO | Source: Ambulatory Visit | Attending: Internal Medicine | Admitting: Internal Medicine

## 2019-07-28 DIAGNOSIS — R05 Cough: Secondary | ICD-10-CM

## 2019-07-28 DIAGNOSIS — D649 Anemia, unspecified: Secondary | ICD-10-CM

## 2019-07-28 DIAGNOSIS — R059 Cough, unspecified: Secondary | ICD-10-CM

## 2019-07-28 MED ORDER — IOPAMIDOL (ISOVUE-300) INJECTION 61%
75.0000 mL | Freq: Once | INTRAVENOUS | Status: AC | PRN
  Administered 2019-07-28: 13:00:00 75 mL via INTRAVENOUS

## 2019-08-07 ENCOUNTER — Telehealth: Payer: Self-pay | Admitting: Oncology

## 2019-08-07 NOTE — Telephone Encounter (Signed)
Received a new patient referral from Dr. Seward Carol. Pt returned my call to schedule an appt with Dr. Alen Blew on 10/28 at 2pm. Pt aware to arrive 20 minutes early.

## 2019-08-15 ENCOUNTER — Other Ambulatory Visit: Payer: Self-pay

## 2019-08-15 ENCOUNTER — Emergency Department (HOSPITAL_COMMUNITY)
Admission: EM | Admit: 2019-08-15 | Discharge: 2019-08-16 | Disposition: A | Discharge status: Home / Self Care | Payer: BC Managed Care – PPO | Source: Home / Self Care

## 2019-08-15 ENCOUNTER — Encounter (HOSPITAL_COMMUNITY): Payer: Self-pay | Admitting: Emergency Medicine

## 2019-08-15 DIAGNOSIS — Z5321 Procedure and treatment not carried out due to patient leaving prior to being seen by health care provider: Secondary | ICD-10-CM | POA: Insufficient documentation

## 2019-08-15 DIAGNOSIS — M25511 Pain in right shoulder: Secondary | ICD-10-CM | POA: Insufficient documentation

## 2019-08-15 DIAGNOSIS — M79604 Pain in right leg: Secondary | ICD-10-CM | POA: Diagnosis not present

## 2019-08-15 DIAGNOSIS — C4921 Malignant neoplasm of connective and soft tissue of right lower limb, including hip: Secondary | ICD-10-CM | POA: Diagnosis not present

## 2019-08-15 MED ORDER — ACETAMINOPHEN 325 MG PO TABS
650.0000 mg | ORAL_TABLET | Freq: Once | ORAL | Status: AC | PRN
  Administered 2019-08-15: 650 mg via ORAL
  Filled 2019-08-15: qty 2

## 2019-08-15 NOTE — ED Notes (Signed)
Patient advised to stay, Patient decided to leave anyways.

## 2019-08-15 NOTE — ED Triage Notes (Addendum)
C/o lower back pain, R hip pain that radiates down R leg, L foot pain, and R shoulder pain x 2 weeks.  Also reports intermittent fever x 2 weeks.    Reports negative COVID test approx 10-12 days ago.  No known injury.

## 2019-08-16 ENCOUNTER — Other Ambulatory Visit: Payer: Self-pay

## 2019-08-16 ENCOUNTER — Emergency Department (HOSPITAL_COMMUNITY): Payer: BC Managed Care – PPO

## 2019-08-16 ENCOUNTER — Emergency Department (HOSPITAL_BASED_OUTPATIENT_CLINIC_OR_DEPARTMENT_OTHER): Payer: BC Managed Care – PPO

## 2019-08-16 ENCOUNTER — Encounter (HOSPITAL_COMMUNITY): Payer: Self-pay | Admitting: Emergency Medicine

## 2019-08-16 ENCOUNTER — Inpatient Hospital Stay (HOSPITAL_COMMUNITY)
Admission: EM | Admit: 2019-08-16 | Discharge: 2019-08-19 | DRG: 543 | Disposition: A | Discharge status: Other Acute Inpatient Hospital | Payer: BC Managed Care – PPO | Attending: Family Medicine | Admitting: Family Medicine

## 2019-08-16 ENCOUNTER — Other Ambulatory Visit: Payer: Self-pay | Admitting: Oncology

## 2019-08-16 DIAGNOSIS — G43909 Migraine, unspecified, not intractable, without status migrainosus: Secondary | ICD-10-CM | POA: Diagnosis present

## 2019-08-16 DIAGNOSIS — M869 Osteomyelitis, unspecified: Secondary | ICD-10-CM | POA: Diagnosis present

## 2019-08-16 DIAGNOSIS — Z7982 Long term (current) use of aspirin: Secondary | ICD-10-CM

## 2019-08-16 DIAGNOSIS — M48061 Spinal stenosis, lumbar region without neurogenic claudication: Secondary | ICD-10-CM | POA: Diagnosis present

## 2019-08-16 DIAGNOSIS — M25551 Pain in right hip: Secondary | ICD-10-CM

## 2019-08-16 DIAGNOSIS — M461 Sacroiliitis, not elsewhere classified: Secondary | ICD-10-CM | POA: Diagnosis not present

## 2019-08-16 DIAGNOSIS — C4921 Malignant neoplasm of connective and soft tissue of right lower limb, including hip: Secondary | ICD-10-CM | POA: Diagnosis present

## 2019-08-16 DIAGNOSIS — Z20828 Contact with and (suspected) exposure to other viral communicable diseases: Secondary | ICD-10-CM | POA: Diagnosis present

## 2019-08-16 DIAGNOSIS — C7801 Secondary malignant neoplasm of right lung: Secondary | ICD-10-CM | POA: Diagnosis present

## 2019-08-16 DIAGNOSIS — C801 Malignant (primary) neoplasm, unspecified: Secondary | ICD-10-CM

## 2019-08-16 DIAGNOSIS — R509 Fever, unspecified: Secondary | ICD-10-CM | POA: Diagnosis present

## 2019-08-16 DIAGNOSIS — R52 Pain, unspecified: Secondary | ICD-10-CM

## 2019-08-16 DIAGNOSIS — C4922 Malignant neoplasm of connective and soft tissue of left lower limb, including hip: Secondary | ICD-10-CM | POA: Diagnosis not present

## 2019-08-16 DIAGNOSIS — C499 Malignant neoplasm of connective and soft tissue, unspecified: Secondary | ICD-10-CM

## 2019-08-16 DIAGNOSIS — R918 Other nonspecific abnormal finding of lung field: Secondary | ICD-10-CM | POA: Diagnosis not present

## 2019-08-16 DIAGNOSIS — D638 Anemia in other chronic diseases classified elsewhere: Secondary | ICD-10-CM

## 2019-08-16 DIAGNOSIS — D649 Anemia, unspecified: Secondary | ICD-10-CM | POA: Diagnosis not present

## 2019-08-16 DIAGNOSIS — R911 Solitary pulmonary nodule: Secondary | ICD-10-CM

## 2019-08-16 DIAGNOSIS — M79604 Pain in right leg: Secondary | ICD-10-CM | POA: Diagnosis present

## 2019-08-16 LAB — IRON AND TIBC
Iron: 13 ug/dL — ABNORMAL LOW (ref 45–182)
Saturation Ratios: 8 % — ABNORMAL LOW (ref 17.9–39.5)
TIBC: 169 ug/dL — ABNORMAL LOW (ref 250–450)
UIBC: 156 ug/dL

## 2019-08-16 LAB — CBC WITH DIFFERENTIAL/PLATELET
Abs Immature Granulocytes: 0.17 10*3/uL — ABNORMAL HIGH (ref 0.00–0.07)
Basophils Absolute: 0.1 10*3/uL (ref 0.0–0.1)
Basophils Relative: 0 %
Eosinophils Absolute: 0.1 10*3/uL (ref 0.0–0.5)
Eosinophils Relative: 0 %
HCT: 31.1 % — ABNORMAL LOW (ref 39.0–52.0)
Hemoglobin: 9.4 g/dL — ABNORMAL LOW (ref 13.0–17.0)
Immature Granulocytes: 1 %
Lymphocytes Relative: 12 %
Lymphs Abs: 2.4 10*3/uL (ref 0.7–4.0)
MCH: 24.4 pg — ABNORMAL LOW (ref 26.0–34.0)
MCHC: 30.2 g/dL (ref 30.0–36.0)
MCV: 80.6 fL (ref 80.0–100.0)
Monocytes Absolute: 1.1 10*3/uL — ABNORMAL HIGH (ref 0.1–1.0)
Monocytes Relative: 5 %
Neutro Abs: 17 10*3/uL — ABNORMAL HIGH (ref 1.7–7.7)
Neutrophils Relative %: 82 %
Platelets: 508 10*3/uL — ABNORMAL HIGH (ref 150–400)
RBC: 3.86 MIL/uL — ABNORMAL LOW (ref 4.22–5.81)
RDW: 15.7 % — ABNORMAL HIGH (ref 11.5–15.5)
WBC: 20.7 10*3/uL — ABNORMAL HIGH (ref 4.0–10.5)
nRBC: 0 % (ref 0.0–0.2)

## 2019-08-16 LAB — SEDIMENTATION RATE: Sed Rate: 102 mm/hr — ABNORMAL HIGH (ref 0–16)

## 2019-08-16 LAB — URINALYSIS, ROUTINE W REFLEX MICROSCOPIC
Bacteria, UA: NONE SEEN
Bilirubin Urine: NEGATIVE
Glucose, UA: NEGATIVE mg/dL
Ketones, ur: NEGATIVE mg/dL
Leukocytes,Ua: NEGATIVE
Nitrite: NEGATIVE
Protein, ur: NEGATIVE mg/dL
Specific Gravity, Urine: 1.011 (ref 1.005–1.030)
pH: 6 (ref 5.0–8.0)

## 2019-08-16 LAB — HIV ANTIBODY (ROUTINE TESTING W REFLEX): HIV Screen 4th Generation wRfx: NONREACTIVE

## 2019-08-16 LAB — COMPREHENSIVE METABOLIC PANEL
ALT: 48 U/L — ABNORMAL HIGH (ref 0–44)
AST: 23 U/L (ref 15–41)
Albumin: 2.6 g/dL — ABNORMAL LOW (ref 3.5–5.0)
Alkaline Phosphatase: 213 U/L — ABNORMAL HIGH (ref 38–126)
Anion gap: 12 (ref 5–15)
BUN: 10 mg/dL (ref 6–20)
CO2: 24 mmol/L (ref 22–32)
Calcium: 9.1 mg/dL (ref 8.9–10.3)
Chloride: 99 mmol/L (ref 98–111)
Creatinine, Ser: 0.84 mg/dL (ref 0.61–1.24)
GFR calc Af Amer: >60 mL/min (ref >60)
GFR calc non Af Amer: >60 mL/min (ref >60)
Glucose, Bld: 103 mg/dL — ABNORMAL HIGH (ref 70–99)
Potassium: 4.4 mmol/L (ref 3.5–5.1)
Sodium: 135 mmol/L (ref 135–145)
Total Bilirubin: 0.7 mg/dL (ref 0.3–1.2)
Total Protein: 7.6 g/dL (ref 6.5–8.1)

## 2019-08-16 LAB — FERRITIN: Ferritin: 1250 ng/mL — ABNORMAL HIGH (ref 24–336)

## 2019-08-16 LAB — VITAMIN B12: Vitamin B-12: 349 pg/mL (ref 180–914)

## 2019-08-16 LAB — C-REACTIVE PROTEIN: CRP: 21.3 mg/dL — ABNORMAL HIGH (ref <1.0)

## 2019-08-16 MED ORDER — KETOROLAC TROMETHAMINE 15 MG/ML IJ SOLN
15.0000 mg | Freq: Once | INTRAMUSCULAR | Status: AC
  Administered 2019-08-16: 15 mg via INTRAVENOUS
  Filled 2019-08-16: qty 1

## 2019-08-16 MED ORDER — ENOXAPARIN SODIUM 40 MG/0.4ML ~~LOC~~ SOLN
40.0000 mg | SUBCUTANEOUS | Status: DC
  Administered 2019-08-16 – 2019-08-17 (×2): 40 mg via SUBCUTANEOUS
  Filled 2019-08-16 (×2): qty 0.4

## 2019-08-16 MED ORDER — ACETAMINOPHEN 500 MG PO TABS
1000.0000 mg | ORAL_TABLET | Freq: Once | ORAL | Status: AC
  Administered 2019-08-16: 18:00:00 1000 mg via ORAL
  Filled 2019-08-16: qty 2

## 2019-08-16 MED ORDER — TRAZODONE HCL 50 MG PO TABS
50.0000 mg | ORAL_TABLET | Freq: Once | ORAL | Status: AC
  Administered 2019-08-16: 22:00:00 50 mg via ORAL
  Filled 2019-08-16: qty 1

## 2019-08-16 MED ORDER — IOHEXOL 300 MG/ML  SOLN
100.0000 mL | Freq: Once | INTRAMUSCULAR | Status: AC | PRN
  Administered 2019-08-16: 100 mL via INTRAVENOUS

## 2019-08-16 MED ORDER — SODIUM CHLORIDE 0.9 % IV BOLUS
1000.0000 mL | Freq: Once | INTRAVENOUS | Status: AC
  Administered 2019-08-16: 1000 mL via INTRAVENOUS

## 2019-08-16 MED ORDER — OXYCODONE-ACETAMINOPHEN 5-325 MG PO TABS
1.0000 | ORAL_TABLET | Freq: Once | ORAL | Status: AC
  Administered 2019-08-16: 09:00:00 1 via ORAL
  Filled 2019-08-16: qty 1

## 2019-08-16 MED ORDER — OXYCODONE-ACETAMINOPHEN 5-325 MG PO TABS
1.0000 | ORAL_TABLET | ORAL | Status: DC
  Administered 2019-08-16 – 2019-08-19 (×13): 1 via ORAL
  Filled 2019-08-16 (×14): qty 1

## 2019-08-16 MED ORDER — MORPHINE SULFATE (PF) 2 MG/ML IV SOLN
2.0000 mg | INTRAVENOUS | Status: DC | PRN
  Administered 2019-08-17: 2 mg via INTRAVENOUS
  Filled 2019-08-16: qty 1

## 2019-08-16 MED ORDER — MORPHINE SULFATE (PF) 4 MG/ML IV SOLN
4.0000 mg | Freq: Once | INTRAVENOUS | Status: AC
  Administered 2019-08-16: 4 mg via INTRAVENOUS
  Filled 2019-08-16: qty 1

## 2019-08-16 NOTE — Progress Notes (Signed)
RLE venous duplex       has been completed. Preliminary results can be found under CV proc through chart review. Janica Eldred, BS, RDMS, RVT   

## 2019-08-16 NOTE — ED Triage Notes (Signed)
Pt. Called Dr. Dr. Delfina Redwood and he sent me Tramadol , it doesn't really work

## 2019-08-16 NOTE — ED Triage Notes (Signed)
Pt. Stated, Im also having rt. Shoulder pain

## 2019-08-16 NOTE — ED Notes (Signed)
Having a lot of pain.

## 2019-08-16 NOTE — ED Notes (Signed)
Patient transported to CT 

## 2019-08-16 NOTE — ED Triage Notes (Signed)
Pt. Pointed to rt. Side at the buttocks area and all the way down his leg.

## 2019-08-16 NOTE — ED Notes (Signed)
The pts pain some better iv fluid half infused

## 2019-08-16 NOTE — ED Provider Notes (Addendum)
[facility] EMERGENCY DEPARTMENT Provider Note   CSN: [id number] Arrival date & time: 08/16/19  3149     History   Chief Complaint Chief Complaint  Patient presents with   Leg Pain    HPI Clinton Davis is a 38 y.o. male who presents with right leg pain.  Past medical history significant for a left thigh mass which is currently being worked up to rule out a sarcoma.  Patient states that he has noticed the pain for about 2 weeks.  About 5 days ago the pain has become much more noticeable and now he is having difficulty walking.  The pain is in the right buttocks, hip, groin, and right calf.  Walking or movement of the leg makes it worse.  His left leg hurts as well but not as bad as the right.  He had a biopsy a week and a half ago on the left thigh and results are pending at this time.  He has never had this pain before.  He denies any injury.  He denies fever, numbness or tingling.  He feels like his lymph nodes in the groin area are swollen.  He reports right shoulder pain as well but he is not as concerned about this.  He has been taking tramadol for pain which is not providing any relief therefore he decided to come to the emergency department this morning. Of note, he came to the ED last night and LWBS. He was noted to have a fever of 101 at that time. He has had 3 negative COVID tests recently.   HPI  History reviewed. No pertinent past medical history.  There are no active problems to display for this patient.   History reviewed. No pertinent surgical history.      Home Medications    Prior to Admission medications   Medication Sig Start Date End Date Taking? Authorizing Provider  amoxicillin (AMOXIL) 500 MG capsule Take 2 capsules (1,000 mg total) by mouth 3 (three) times daily. For 10 days Patient not taking: Reported on 12/23/2014 01/04/13   Schorr, Rhetta Mura, NP  HYDROcodone-acetaminophen (NORCO/VICODIN) 5-325 MG per tablet Take 2 tablets by mouth every  4 (four) hours as needed for pain. Patient not taking: Reported on 12/23/2014 01/04/13   Schorr, Rhetta Mura, NP    Family History No family history on file.  Social History Social History   Tobacco Use   Smoking status: Never Smoker   Smokeless tobacco: Never Used  Substance Use Topics   Alcohol use: No   Drug use: No     Allergies   Patient has no known allergies.   Review of Systems Review of Systems  Constitutional: Negative for fever.  Respiratory: Negative for shortness of breath.   Cardiovascular: Negative for chest pain and leg swelling.  Gastrointestinal: Negative for abdominal pain.  Musculoskeletal: Positive for arthralgias, back pain, gait problem and myalgias. Negative for joint swelling.     Physical Exam Updated Vital Signs BP 110/88 (BP Location: Right Arm)    Pulse (!) 101    Temp 98.5 F (36.9 C) (Oral)    Resp 20    SpO2 100%   Physical Exam Vitals signs and nursing note reviewed.  Constitutional:      General: He is not in acute distress.    Appearance: Normal appearance. He is well-developed. He is not ill-appearing.     Comments: Calm and cooperative. NAD  HENT:     Head: Normocephalic and atraumatic.  Eyes:     General: No scleral icterus.       Right eye: No discharge.        Left eye: No discharge.     Conjunctiva/sclera: Conjunctivae normal.     Pupils: Pupils are equal, round, and reactive to light.  Neck:     Musculoskeletal: Normal range of motion.  Cardiovascular:     Rate and Rhythm: Tachycardia present.  Pulmonary:     Effort: Pulmonary effort is normal. No respiratory distress.     Breath sounds: Normal breath sounds.  Abdominal:     General: There is no distension.     Palpations: Abdomen is soft.     Tenderness: There is no abdominal tenderness.  Musculoskeletal:     Comments: Back: Tenderness of the lumbar spine, right lumbar paraspinal muscles, and right buttocks.   Right hip: No tenderness. Pain is elicited in  the groin area with ROM of hip. Mild inguinal lymphadenopathy  Right knee: No tenderness, swelling, redness.   Right calf: Tenderness. No swelling. 2+ dp pulse  Skin:    General: Skin is warm and dry.  Neurological:     Mental Status: He is alert and oriented to person, place, and time.  Psychiatric:        Behavior: Behavior normal.      ED Treatments / Results  Labs (all labs ordered are listed, but only abnormal results are displayed) Labs Reviewed  URINALYSIS, ROUTINE W REFLEX MICROSCOPIC - Abnormal; Notable for the following components:      Result Value   Hgb urine dipstick MODERATE (*)    All other components within normal limits  COMPREHENSIVE METABOLIC PANEL - Abnormal; Notable for the following components:   Glucose, Bld 103 (*)    Albumin 2.6 (*)    ALT 48 (*)    Alkaline Phosphatase 213 (*)    All other components within normal limits  CBC WITH DIFFERENTIAL/PLATELET - Abnormal; Notable for the following components:   WBC 20.7 (*)    RBC 3.86 (*)    Hemoglobin 9.4 (*)    HCT 31.1 (*)    MCH 24.4 (*)    RDW 15.7 (*)    Platelets 508 (*)    Neutro Abs 17.0 (*)    Monocytes Absolute 1.1 (*)    Abs Immature Granulocytes 0.17 (*)    All other components within normal limits  SEDIMENTATION RATE - Abnormal; Notable for the following components:   Sed Rate 102 (*)    All other components within normal limits  C-REACTIVE PROTEIN - Abnormal; Notable for the following components:   CRP 21.3 (*)    All other components within normal limits  LYME DISEASE, WESTERN BLOT    EKG None  Radiology Dg Chest 2 View  Result Date: 08/16/2019 CLINICAL DATA:  Right hip pain, weakness, fever EXAM: CHEST - 2 VIEW COMPARISON:  Chest radiograph, 06/08/2019, CT chest, 07/28/2019 FINDINGS: The heart size and mediastinal contours are within normal limits. Rounded nodules in the right lung appear substantially enlarged compared to CT dated 07/28/2019 and are new in comparison to  chest radiographs dated 06/08/2019. The visualized skeletal structures are unremarkable. IMPRESSION: 1.  No acute abnormality of the lungs. 2. Rounded nodules in the right lung appear substantially enlarged compared to CT dated 07/28/2019 and are new in comparison to chest radiographs dated 06/08/2019, concerning for enlarging metastatic nodules. Electronically Signed   By: Eddie Candle M.D.   On: 08/16/2019 13:15   Dg Lumbar  Spine Complete  Result Date: 08/16/2019 CLINICAL DATA:  Low back pain with right-sided radicular symptoms EXAM: LUMBAR SPINE - COMPLETE 4+ VIEW COMPARISON:  None. FINDINGS: Frontal, lateral, spot lumbosacral lateral, and bilateral oblique views were obtained. There are 5 non-rib-bearing lumbar type vertebral bodies. There is no fracture or spondylolisthesis. There is moderate narrowing at L5-S1. Other disc spaces appear unremarkable. There is no appreciable facet arthropathy. IMPRESSION: Disc space narrowing at L5-S1. Other disc spaces appear unremarkable. No appreciable arthropathy. No fracture or spondylolisthesis. Electronically Signed   By: Lowella Grip III M.D.   On: 08/16/2019 10:30   Dg Shoulder Right  Result Date: 08/16/2019 CLINICAL DATA:  Pain EXAM: RIGHT SHOULDER - 2+ VIEW COMPARISON:  Chest CT July 28, 2019 FINDINGS: Oblique and Y scapular images were obtained. No evident fracture or dislocation. Joint spaces appear normal. No erosive change or intra-articular calcification. Nodular opacity right upper lobe documented on recent CT is apparent by radiography. IMPRESSION: No fracture or dislocation. No evident arthropathy. Nodular opacity right upper lobe, documented on recent chest CT. Electronically Signed   By: Lowella Grip III M.D.   On: 08/16/2019 10:32   Ct Pelvis W Contrast  Result Date: 08/16/2019 CLINICAL DATA:  Right gluteal pain for several days. No reported injury. Clinical concern for myositis or abscess. EXAM: CT PELVIS WITH CONTRAST TECHNIQUE:  Multidetector CT imaging of the pelvis was performed using the standard protocol following the bolus administration of intravenous contrast. CONTRAST:  167m OMNIPAQUE IOHEXOL 300 MG/ML  SOLN COMPARISON:  08/16/2019 pelvic and right hip radiographs. FINDINGS: Urinary Tract:  Normal bladder. Normal caliber pelvic ureters. Bowel:  Normal caliber pelvic bowel loops. No bowel wall thickening. Vascular/Lymphatic: Mild left iliac atherosclerosis. No acute vascular abnormality. No pathologically enlarged pelvic lymph nodes. Reproductive:  Normal size prostate. Other: No pelvic ascites, pneumoperitoneum or focal fluid collection. Tiny umbilical fat containing hernia. Musculoskeletal: No aggressive appearing focal osseous lesions. There is a large 11.6 x 7.4 x 18.8 cm intramuscular mass in the anterolateral proximal left thigh (series 3/image 63) with irregular poorly defined margins and markedly heterogeneous internal soft tissue density with hypodense and hyperdense components, the inferior extent of which is not visualized on this CT. No periosteal reaction or erosive changes in the adjacent proximal left femur. No gluteal region mass or fluid collection. IMPRESSION: 1. Large 11.6 x 7.4 x 18.8 cm intramuscular mass in the anterolateral proximal left thigh, with irregular poorly defined margins and markedly heterogeneous internal soft tissue density with hypodense and hyperdense components. Soft tissue sarcoma is the diagnosis of exclusion. MRI of the left thigh without and with IV contrast is strongly recommended for further characterization. 2. No pelvic lymphadenopathy. Electronically Signed   By: JIlona SorrelM.D.   On: 08/16/2019 14:30   Dg Hip Unilat W Or Wo Pelvis 2-3 Views Right  Result Date: 08/16/2019 CLINICAL DATA:  Progressive pain EXAM: DG HIP (WITH OR WITHOUT PELVIS) 2-3V RIGHT COMPARISON:  None. FINDINGS: Frontal pelvis as well as frontal and lateral right hip images were obtained. No fracture or  dislocation. There is slight narrowing of each hip joint. There is mild bony overgrowth along each lateral acetabulum. No erosive changes. Sacroiliac joints appear unremarkable bilaterally. A small bone island is noted in the right femoral neck. IMPRESSION: No fracture or dislocation. Symmetric bony overgrowth along each lateral acetabulum. This finding places patient at increased risk for femoroacetabular syndrome. There is mild symmetric narrowing of each hip joint. Electronically Signed   By: WGwyndolyn Saxon  Jasmine December III M.D.   On: 08/16/2019 10:29   Vas [place] Lower Extremity Venous (dvt) (only [facility])  Result Date: 08/16/2019  Lower Venous Study Indications: Buttock pain radiating down leg.  Comparison Study: no prior Performing Technologist: June Leap RDMS, RVT  Examination Guidelines: A complete evaluation includes B-mode imaging, spectral Doppler, color Doppler, and power Doppler as needed of all accessible portions of each vessel. Bilateral testing is considered an integral part of a complete examination. Limited examinations for reoccurring indications may be performed as noted.  +---------+---------------+---------+-----------+----------+--------------+  RIGHT     Compressibility Phasicity Spontaneity Properties Thrombus Aging  +---------+---------------+---------+-----------+----------+--------------+  CFV       Full            Yes       Yes                                    +---------+---------------+---------+-----------+----------+--------------+  SFJ       Full                                                             +---------+---------------+---------+-----------+----------+--------------+  FV Prox   Full                                                             +---------+---------------+---------+-----------+----------+--------------+  FV Mid    Full                                                             +---------+---------------+---------+-----------+----------+--------------+  FV  Distal Full                                                             +---------+---------------+---------+-----------+----------+--------------+  PFV       Full                                                             +---------+---------------+---------+-----------+----------+--------------+  POP       Full            Yes       Yes                                    +---------+---------------+---------+-----------+----------+--------------+  PTV       Full                                                             +---------+---------------+---------+-----------+----------+--------------+  PERO      Full                                                             +---------+---------------+---------+-----------+----------+--------------+     Summary: Right: There is no evidence of deep vein thrombosis in the lower extremity. No cystic structure found in the popliteal fossa.  *See table(s) above for measurements and observations.    Preliminary     Procedures Procedures (including critical care time)  Medications Ordered in ED Medications  ketorolac (TORADOL) 15 MG/ML injection 15 mg (has no administration in time range)  acetaminophen (TYLENOL) tablet 1,000 mg (has no administration in time range)  oxyCODONE-acetaminophen (PERCOCET/ROXICET) 5-325 MG per tablet 1 tablet (1 tablet Oral Given 08/16/19 0928)  iohexol (OMNIPAQUE) 300 MG/ML solution 100 mL (100 mLs Intravenous Contrast Given 08/16/19 1343)  morphine 4 MG/ML injection 4 mg (4 mg Intravenous Given 08/16/19 1424)     Initial Impression / Assessment and Plan / ED Course  I have reviewed the triage vital signs and the nursing notes.  Pertinent labs & imaging results that were available during my care of the patient were reviewed by me and considered in my medical decision making (see chart for details).  38 year old male presents with R back, hip/pelvis, leg pain for 2 weeks. Worse in the past several days and he's having  difficulty walking. He denies bowel/bladder dysfunction or leg weakness and is limited secondary to pain. He is mildly tachycardic. He had a documented fever of 101 last night but none today. On exam he is in severe pain and pain is elicited with ROM of the leg. There are no obvious areas of swelling or redness. Will provide pain control with percocet and obtain Xray of the back and hip as well as DVT study  Xray of lumbar spine and hip are negative. DVT study is negative. CBC is remarkable for leukocytosis of 20.7 with neutrophil predominance, hgb of 9.4, and platelets 508. CMP is remarkable for elevated AP (213). CRP is 21.3. ESR is 102.   Shared visit with Dr. Sedonia Small. Pt is still having significant pain. Will add IV Morphine. 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 Jana Hakim - he states that if pt is having intractable pain he would admit pt and they will consult. He states that since sarcoma is rare they usually will end up referring to [facility] as they have a sarcoma. Discussed plan with patient and family. At this time he is having severe intractable pain. Will attempt toradol and tylenol but likely pt will require admission. Pt care signed out to [employer].  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

## 2019-08-16 NOTE — Progress Notes (Signed)
Arrived at patient room for LE venous. Patient is not in room and no one at desk knows where he is at.  Will check back.  June Leap, BS, RDMS, RVT Vascular lab

## 2019-08-16 NOTE — H&P (Signed)
History and Physical    Clinton Davis [id number] DOB: 1981-01-02 DOA: 08/16/2019  PCP: Seward Carol, MD (Confirm with patient/family/NH records and if not entered, this has to be entered at [facility] point of entry) Patient coming from: Home, wife at bedside  I have personally briefly reviewed patient's old medical records in [place]  Chief Complaint: Right pelvic, buttock and posterior thigh pain  HPI: Clinton Davis is a 38 y.o. 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 Pain started initially on the left leg and left pelvic area about 2 weeks ago but gradually seemed to have migrated to his right buttock, pelvic and posterior thigh. Also has pain to right inguinal area. Pain is worse with weight bearing, ambulating and internally rotating his leg. Has difficult with walking short distances. Pain became more severe about 5 days ago and he started taking old prescriptions of hydrocodone and Tylenol at home.  Saw his PCP 2 days ago and was also started on tramadol.  Patient also has on and off fever, chills and night sweats.  He states that this started back in May prior to his diagnosis of his left anterior thigh spindle cell carcinoma.  He has been tested for COVID in July, September and October which have all returned negative.  Denies any chest pain, shortness of breath.  Denies any nausea, vomiting or diarrhea.  Although he does have some loose stools.  Denies any numbness or tingling of the extremities.  No limb weakness.  ED Course: He was febrile up to 101.1, normotensive from room air.  CBC showed leukocytosis of 20.7, anemia of 9.4.  Platelet of 508.  CRP elevated at 21.3.  Sed rate of 102.  CT pelvis with contrast showed large intramuscular mass in the anterior lateral proximal left thigh but no findings of the right lower extremity.   No pelvic lymphadenopathy.  Negative DVT of the right lower extremity.  Review of Systems:  Constitutional: No Weight Change, + Fever ENT/Mouth: No sore throat, No Rhinorrhea Eyes: No Eye Pain, No Vision Changes Cardiovascular: No Chest Pain, no SOB Respiratory: No Cough, No Sputum, No Wheezing, no Dyspnea  Gastrointestinal: No Nausea, No Vomiting, No Diarrhea, No Constipation, No Pain Genitourinary: no Urinary Incontinence, No Urgency, No Flank Pain Musculoskeletal: No Arthralgias, No Myalgias Skin: No Skin Lesions, No Pruritus, Neuro: no Weakness, No Numbness,  No Loss of Consciousness, No Syncope Psych: No Anxiety/Panic, No Depression, no decrease appetite Heme/Lymph: No Bruising, No Bleeding  History reviewed. No pertinent past medical history.  History reviewed. No pertinent surgical history.   reports that he has never smoked. He has never used smokeless tobacco. He reports that he does not drink alcohol or use drugs.  No Known Allergies  No family history on file. Family history reviewed and not pertinent   Prior to Admission medications   Medication Sig Start Date End Date Taking? Authorizing Provider  aspirin EC 325 MG tablet Take 325 mg by mouth daily. 08/05/19 08/19/19 Yes [provider]  etodolac (LODINE) 400 MG tablet Take 400 mg by mouth 2 (two) times daily as needed. 07/20/19  Yes [provider]  nortriptyline (PAMELOR) 10 MG capsule Take 10 mg by mouth at bedtime. 08/12/19  Yes [provider]  tadalafil (CIALIS) 10 MG tablet Take 10 mg by mouth as needed.   Yes [provider]  traMADol (  ULTRAM) 50 MG tablet Take 50 mg by mouth daily as needed for pain. 08/14/19  Yes [provider]  rizatriptan (MAXALT) 10 MG tablet Take 10 mg by mouth daily as needed for migraine. 08/13/19   [provider]    Physical Exam: Vitals:   08/16/19 1500 08/16/19 1600 08/16/19 1700 08/16/19 1800  BP: (!) 108/57 114/65 (!)  92/49 (!) 109/58  Pulse: 92 88 99 92  Resp:      Temp:      TempSrc:      SpO2: 97% 98% 98% 97%    Constitutional: NAD, calm, comfortable male laying in bed.   Vitals:   08/16/19 1500 08/16/19 1600 08/16/19 1700 08/16/19 1800  BP: (!) 108/57 114/65 (!) 92/49 (!) 109/58  Pulse: 92 88 99 92  Resp:      Temp:      TempSrc:      SpO2: 97% 98% 98% 97%   Eyes: PERRL, lids and conjunctivae normal ENMT: Mucous membranes are moist. Posterior pharynx clear of any exudate or lesions.Normal dentition.  Neck: normal, supple, no masses, no thyromegaly Respiratory: clear to auscultation bilaterally, no wheezing, no crackles. Normal respiratory effort. No accessory muscle use.  Cardiovascular: Regular rate and rhythm, no murmurs / rubs / gallops. No extremity edema. 2+ pedal pulses. No carotid bruits.  Abdomen: no tenderness, no masses palpated. No hepatosplenomegaly. Bowel sounds positive.  Musculoskeletal: no clubbing / cyanosis.  Large mass to the left anterior lateral thigh with overlying clear bandages.  No obvious deformities or dislocation of the pelvis or right lower extremity.  Patient was able to slowly scoot down out of bed and stand with assistance of the hand rail.  There is pain to palpation of the right pelvic rim, right side of the sacrum and right ischial spine as well as down the right posterior thigh.  Also had pain palpation of the right inguinal region with no palpable lymphadenopathy. Skin: no rashes, lesions, ulcers. No induration Neurologic: CN 2-12 grossly intact. Sensation intact, DTR normal. Strength 5/5 in all 4.  Psychiatric: Normal judgment and insight. Alert and oriented x 3. Normal mood.     Labs on Admission: I have personally reviewed following labs and imaging studies  CBC: Recent Labs  Lab 08/16/19 1230  WBC 20.7*  NEUTROABS 17.0*  HGB 9.4*  HCT 31.1*  MCV 80.6  PLT 123XX123*   Basic Metabolic Panel: Recent Labs  Lab 08/16/19 1230  NA 135  K 4.4  CL 99   CO2 24  GLUCOSE 103*  BUN 10  CREATININE 0.84  CALCIUM 9.1   GFR: CrCl cannot be calculated (Unknown ideal weight.). Liver Function Tests: Recent Labs  Lab 08/16/19 1230  AST 23  ALT 48*  ALKPHOS 213*  BILITOT 0.7  PROT 7.6  ALBUMIN 2.6*   No results for input(s): LIPASE, AMYLASE in the last 168 hours. No results for input(s): AMMONIA in the last 168 hours. Coagulation Profile: No results for input(s): INR, PROTIME in the last 168 hours. Cardiac Enzymes: No results for input(s): CKTOTAL, CKMB, CKMBINDEX, TROPONINI in the last 168 hours. BNP (last 3 results) No results for input(s): PROBNP in the last 8760 hours. HbA1C: No results for input(s): HGBA1C in the last 72 hours. CBG: No results for input(s): GLUCAP in the last 168 hours. Lipid Profile: No results for input(s): CHOL, HDL, LDLCALC, TRIG, CHOLHDL, LDLDIRECT in the last 72 hours. Thyroid Function Tests: No results for input(s): TSH, T4TOTAL, FREET4, T3FREE, THYROIDAB in the  last 72 hours. Anemia Panel: No results for input(s): VITAMINB12, FOLATE, FERRITIN, TIBC, IRON, RETICCTPCT in the last 72 hours. Urine analysis:    Component Value Date/Time   COLORURINE YELLOW 08/16/2019 1200   APPEARANCEUR CLEAR 08/16/2019 1200   LABSPEC 1.011 08/16/2019 1200   PHURINE 6.0 08/16/2019 1200   GLUCOSEU NEGATIVE 08/16/2019 1200   HGBUR MODERATE (A) 08/16/2019 1200   BILIRUBINUR NEGATIVE 08/16/2019 1200   KETONESUR NEGATIVE 08/16/2019 1200   PROTEINUR NEGATIVE 08/16/2019 1200   NITRITE NEGATIVE 08/16/2019 1200   LEUKOCYTESUR NEGATIVE 08/16/2019 1200    Radiological Exams on Admission: Dg Chest 2 View  Result Date: 08/16/2019 CLINICAL DATA:  Right hip pain, weakness, fever EXAM: CHEST - 2 VIEW COMPARISON:  Chest radiograph, 06/08/2019, CT chest, 07/28/2019 FINDINGS: The heart size and mediastinal contours are within normal limits. Rounded nodules in the right lung appear substantially enlarged compared to CT dated  07/28/2019 and are new in comparison to chest radiographs dated 06/08/2019. The visualized skeletal structures are unremarkable. IMPRESSION: 1.  No acute abnormality of the lungs. 2. Rounded nodules in the right lung appear substantially enlarged compared to CT dated 07/28/2019 and are new in comparison to chest radiographs dated 06/08/2019, concerning for enlarging metastatic nodules. Electronically Signed   By: Eddie Candle M.D.   On: 08/16/2019 13:15   Dg Lumbar Spine Complete  Result Date: 08/16/2019 CLINICAL DATA:  Low back pain with right-sided radicular symptoms EXAM: LUMBAR SPINE - COMPLETE 4+ VIEW COMPARISON:  None. FINDINGS: Frontal, lateral, spot lumbosacral lateral, and bilateral oblique views were obtained. There are 5 non-rib-bearing lumbar type vertebral bodies. There is no fracture or spondylolisthesis. There is moderate narrowing at L5-S1. Other disc spaces appear unremarkable. There is no appreciable facet arthropathy. IMPRESSION: Disc space narrowing at L5-S1. Other disc spaces appear unremarkable. No appreciable arthropathy. No fracture or spondylolisthesis. Electronically Signed   By: Lowella Grip III M.D.   On: 08/16/2019 10:30   Dg Shoulder Right  Result Date: 08/16/2019 CLINICAL DATA:  Pain EXAM: RIGHT SHOULDER - 2+ VIEW COMPARISON:  Chest CT July 28, 2019 FINDINGS: Oblique and Y scapular images were obtained. No evident fracture or dislocation. Joint spaces appear normal. No erosive change or intra-articular calcification. Nodular opacity right upper lobe documented on recent CT is apparent by radiography. IMPRESSION: No fracture or dislocation. No evident arthropathy. Nodular opacity right upper lobe, documented on recent chest CT. Electronically Signed   By: Lowella Grip III M.D.   On: 08/16/2019 10:32   Ct Pelvis W Contrast  Result Date: 08/16/2019 CLINICAL DATA:  Right gluteal pain for several days. No reported injury. Clinical concern for myositis or abscess.  EXAM: CT PELVIS WITH CONTRAST TECHNIQUE: Multidetector CT imaging of the pelvis was performed using the standard protocol following the bolus administration of intravenous contrast. CONTRAST:  123mL OMNIPAQUE IOHEXOL 300 MG/ML  SOLN COMPARISON:  08/16/2019 pelvic and right hip radiographs. FINDINGS: Urinary Tract:  Normal bladder. Normal caliber pelvic ureters. Bowel:  Normal caliber pelvic bowel loops. No bowel wall thickening. Vascular/Lymphatic: Mild left iliac atherosclerosis. No acute vascular abnormality. No pathologically enlarged pelvic lymph nodes. Reproductive:  Normal size prostate. Other: No pelvic ascites, pneumoperitoneum or focal fluid collection. Tiny umbilical fat containing hernia. Musculoskeletal: No aggressive appearing focal osseous lesions. There is a large 11.6 x 7.4 x 18.8 cm intramuscular mass in the anterolateral proximal left thigh (series 3/image 63) with irregular poorly defined margins and markedly heterogeneous internal soft tissue density with hypodense and hyperdense components, the inferior  extent of which is not visualized on this CT. No periosteal reaction or erosive changes in the adjacent proximal left femur. No gluteal region mass or fluid collection. IMPRESSION: 1. Large 11.6 x 7.4 x 18.8 cm intramuscular mass in the anterolateral proximal left thigh, with irregular poorly defined margins and markedly heterogeneous internal soft tissue density with hypodense and hyperdense components. Soft tissue sarcoma is the diagnosis of exclusion. MRI of the left thigh without and with IV contrast is strongly recommended for further characterization. 2. No pelvic lymphadenopathy. Electronically Signed   By: Ilona Sorrel M.D.   On: 08/16/2019 14:30   Dg Hip Unilat W Or Wo Pelvis 2-3 Views Right  Result Date: 08/16/2019 CLINICAL DATA:  Progressive pain EXAM: DG HIP (WITH OR WITHOUT PELVIS) 2-3V RIGHT COMPARISON:  None. FINDINGS: Frontal pelvis as well as frontal and lateral right hip  images were obtained. No fracture or dislocation. There is slight narrowing of each hip joint. There is mild bony overgrowth along each lateral acetabulum. No erosive changes. Sacroiliac joints appear unremarkable bilaterally. A small bone island is noted in the right femoral neck. IMPRESSION: No fracture or dislocation. Symmetric bony overgrowth along each lateral acetabulum. This finding places patient at increased risk for femoroacetabular syndrome. There is mild symmetric narrowing of each hip joint. Electronically Signed   By: Lowella Grip III M.D.   On: 08/16/2019 10:29   Vas [place] Lower Extremity Venous (dvt) (only [facility])  Result Date: 08/16/2019  Lower Venous Study Indications: Buttock pain radiating down leg.  Comparison Study: no prior Performing Technologist: June Leap RDMS, RVT  Examination Guidelines: A complete evaluation includes B-mode imaging, spectral Doppler, color Doppler, and power Doppler as needed of all accessible portions of each vessel. Bilateral testing is considered an integral part of a complete examination. Limited examinations for reoccurring indications may be performed as noted.  +---------+---------------+---------+-----------+----------+--------------+  RIGHT     Compressibility Phasicity Spontaneity Properties Thrombus Aging  +---------+---------------+---------+-----------+----------+--------------+  CFV       Full            Yes       Yes                                    +---------+---------------+---------+-----------+----------+--------------+  SFJ       Full                                                             +---------+---------------+---------+-----------+----------+--------------+  FV Prox   Full                                                             +---------+---------------+---------+-----------+----------+--------------+  FV Mid    Full                                                              +---------+---------------+---------+-----------+----------+--------------+  FV Distal Full                                                             +---------+---------------+---------+-----------+----------+--------------+  PFV       Full                                                             +---------+---------------+---------+-----------+----------+--------------+  POP       Full            Yes       Yes                                    +---------+---------------+---------+-----------+----------+--------------+  PTV       Full                                                             +---------+---------------+---------+-----------+----------+--------------+  PERO      Full                                                             +---------+---------------+---------+-----------+----------+--------------+     Summary: Right: There is no evidence of deep vein thrombosis in the lower extremity. No cystic structure found in the popliteal fossa.  *See table(s) above for measurements and observations. Electronically signed by Monica Martinez MD on 08/16/2019 at 5:05:37 PM.    Final       Assessment/Plan Right pelvic, ischial and posterior thigh pain -Negative DVT ultrasound -Negative findings on CT pelvis regarding the right sided structures -Unclear etiology of his pain although suspect possible metastasis of his left anterior lateral thigh intramuscular mass -Appreciate further recommendations from oncology regarding imaging. -Pain control with scheduled oxycodone every 4 hours and as needed 2 mg IV morphine  Left anterior lateral intramuscular mass -Patient had 2 ultrasound biopsies of the mass.  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  Patient also endorsed on and off fever since May.  Has been tested negative for  COVID x3 - Suspect this is due to tumor burden.  Chest x-ray negative for any acute findings.  Negative CT pelvis and lumbar spine. Negative right shoulder X-ray. - Obtain blood cultures - Follow fever curve - does not feel he warrants empiric antibiotics at this time  Anemia - Hemoglobin of 9.4  - Check iron panel, vit 123456, folic and FOBT  Lung nodules -Rounded nodules in right lung is enlarged compared to previous CT on 07/28/2019, concerning for enlarging metastatic nodules -  Appreciate oncology recommendations  DVT prophylaxis:.Lovenox Code Status:Full Family Communication: Plan discussed with patient and wife at bedside  disposition Plan: Home with at least 2 midnight stays  Consults called: Oncology Admission status: inpatient   Palmira Stickle T Florencio Hollibaugh DO [facility]   If 7PM-7AM, please contact night-coverage [email or web address] Password [email or web address]  08/16/2019, 7:17 PM

## 2019-08-16 NOTE — ED Notes (Signed)
Patient transported to X-ray 

## 2019-08-16 NOTE — ED Notes (Addendum)
Patient back from  X-ray 

## 2019-08-16 NOTE — Progress Notes (Signed)
Attempt x 2 for LE venous.  Patient getting IV started and blood work.   Will check back again.  June Leap, BS, RDMS, RVT Vascular lab

## 2019-08-16 NOTE — ED Triage Notes (Signed)
Pt. Stated, Im having leg , hip pain started hurting on Tuesday. Right is a lot worse than left. I can not walk , or stand.

## 2019-08-16 NOTE — ED Provider Notes (Signed)
Care assumed from [place] at shift change, please see her note for full details, but in brief Clinton Davis is a 38 y.o. male presenting with pain in the right low back, hip and right leg which has been present over the past 2 weeks but significantly worsening over the past 5 days.  Patient is currently being worked up for a left thigh mass concerning for sarcoma, recently had tissue biopsy at [facility] and has been referred to Dr. Alen Blew with oncology, but has not yet started treatment.  Pain is reproducible with movement, but imaging here in the ED has not showed any acute abnormalities, no bony fractures or lesions, no masses or enlarged lymph nodes noted around the left hip pelvis or leg and negative DVT study.  Was noted to have low-grade fever and tachycardia on arrival, but has been having low-grade fevers intermittently over the past month and a half with working cancer diagnosis.  Has had 3 - Covid test recently.  Lab work showed a leukocytosis of 20.7, hemoglobin of 9.4, no significant electrolyte derangements.  Chest x-ray is clear.  Despite reassuring imaging and work-up unable to get patient's pain under control.  Dr. Jana Hakim with oncology was consulted who recommends admission for pain control and oncology will consulted.  After multiple narcotic pain agents, Toradol and Tylenol patient reports pain remains a 9/10.  Patient with leukocytosis low-grade fever and tachycardia, but will discuss with hospitalist prior to starting any antibiotics given the patient has been having intermittent fevers for over a month now.  Case discussed with Dr. Flossie Buffy with [facility] who will see and evaluate the patient, she would like to see the patient prior to starting any antibiotics or additional medications.   Jacqlyn Larsen, PA-C 08/16/19 [place], [place], DO 08/19/19 1241

## 2019-08-16 NOTE — ED Notes (Signed)
Pt c/o pain to groin and bottom.

## 2019-08-16 NOTE — ED Notes (Signed)
Gave pt urinal to obtain specimen. Pt states he cannot void at this time.

## 2019-08-17 ENCOUNTER — Inpatient Hospital Stay (HOSPITAL_COMMUNITY): Payer: BC Managed Care – PPO

## 2019-08-17 ENCOUNTER — Other Ambulatory Visit: Payer: Self-pay | Admitting: Oncology

## 2019-08-17 DIAGNOSIS — M79604 Pain in right leg: Secondary | ICD-10-CM | POA: Diagnosis not present

## 2019-08-17 DIAGNOSIS — R52 Pain, unspecified: Secondary | ICD-10-CM | POA: Diagnosis not present

## 2019-08-17 DIAGNOSIS — R509 Fever, unspecified: Secondary | ICD-10-CM | POA: Diagnosis not present

## 2019-08-17 LAB — BASIC METABOLIC PANEL
Anion gap: 10 (ref 5–15)
BUN: 10 mg/dL (ref 6–20)
CO2: 25 mmol/L (ref 22–32)
Calcium: 8.8 mg/dL — ABNORMAL LOW (ref 8.9–10.3)
Chloride: 104 mmol/L (ref 98–111)
Creatinine, Ser: 0.99 mg/dL (ref 0.61–1.24)
GFR calc Af Amer: >60 mL/min (ref >60)
GFR calc non Af Amer: >60 mL/min (ref >60)
Glucose, Bld: 133 mg/dL — ABNORMAL HIGH (ref 70–99)
Potassium: 4.3 mmol/L (ref 3.5–5.1)
Sodium: 139 mmol/L (ref 135–145)

## 2019-08-17 LAB — CBC
HCT: 27 % — ABNORMAL LOW (ref 39.0–52.0)
Hemoglobin: 8.2 g/dL — ABNORMAL LOW (ref 13.0–17.0)
MCH: 24.2 pg — ABNORMAL LOW (ref 26.0–34.0)
MCHC: 30.4 g/dL (ref 30.0–36.0)
MCV: 79.6 fL — ABNORMAL LOW (ref 80.0–100.0)
Platelets: 454 10*3/uL — ABNORMAL HIGH (ref 150–400)
RBC: 3.39 MIL/uL — ABNORMAL LOW (ref 4.22–5.81)
RDW: 15.8 % — ABNORMAL HIGH (ref 11.5–15.5)
WBC: 17.2 10*3/uL — ABNORMAL HIGH (ref 4.0–10.5)
nRBC: 0 % (ref 0.0–0.2)

## 2019-08-17 LAB — SARS CORONAVIRUS 2 (TAT 6-24 HRS): SARS Coronavirus 2: NEGATIVE

## 2019-08-17 MED ORDER — KETOROLAC TROMETHAMINE 15 MG/ML IJ SOLN
15.0000 mg | Freq: Four times a day (QID) | INTRAMUSCULAR | Status: AC
  Administered 2019-08-17 – 2019-08-19 (×8): 15 mg via INTRAVENOUS
  Filled 2019-08-17 (×8): qty 1

## 2019-08-17 MED ORDER — TRAMADOL HCL 50 MG PO TABS
50.0000 mg | ORAL_TABLET | Freq: Four times a day (QID) | ORAL | Status: DC
  Administered 2019-08-17 – 2019-08-19 (×10): 50 mg via ORAL
  Filled 2019-08-17 (×10): qty 1

## 2019-08-17 MED ORDER — KETOROLAC TROMETHAMINE 15 MG/ML IJ SOLN
15.0000 mg | Freq: Once | INTRAMUSCULAR | Status: AC
  Administered 2019-08-17: 05:00:00 15 mg via INTRAVENOUS
  Filled 2019-08-17: qty 1

## 2019-08-17 MED ORDER — GADOBUTROL 1 MMOL/ML IV SOLN
7.0000 mL | Freq: Once | INTRAVENOUS | Status: AC | PRN
  Administered 2019-08-17: 7 mL via INTRAVENOUS

## 2019-08-17 MED ORDER — HYDROMORPHONE HCL 1 MG/ML IJ SOLN
0.5000 mg | INTRAMUSCULAR | Status: DC | PRN
  Administered 2019-08-19 (×2): 0.5 mg via INTRAVENOUS
  Filled 2019-08-17 (×2): qty 0.5

## 2019-08-17 NOTE — Progress Notes (Signed)
Brief Oncology note:  I was made aware of the patient's admission.  He is scheduled to see Dr. Alen Blew at the [facility] on 08/21/2019.  He was admitted with uncontrolled pain.  Reports that pain is better controlled at this time.  He is still taking some IV pain medications in addition to oral medications.  The patient states that he is scheduled for outpatient follow-up with orthopedic surgery tomorrow.  He is unsure if he will be able to make this appointment.  I have discussed with the patient that once his pain is under better control, he can be discharged home.  We will keep his outpatient new patient appointment as previously scheduled.  I also discussed with the patient that he may benefit from follow-up at a tertiary care center for a second opinion regarding treatment of his malignancy. He will keep follow-up at the [facility] on 1-0/30 as scheduled.  Mikey Bussing, DNP, AGPCNP-BC, AOCNP    ACCESSION NUMBER: [id number] RECEIVED: 06/16/2019 ORDERING PHYSICIAN: SCOTT Marlene Lard , MD PATIENT NAME: Clinton Davis SURGICAL PATHOLOGY REPORT * Amended *  FINAL PATHOLOGIC DIAGNOSIS MICROSCOPIC EXAMINATION AND DIAGNOSIS AMENDED TO INCLUDE OPINION OF OUTSIDE CONSULTANT. ORIGINALLY SIGNED OUT 07/07/19.  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 See Comment.   COMMENT: Amended comment: This case was originally signed out on 07/07/19 as "Spindle and epithelioid cell neoplasm." The case was sent to [facility] for expert consultation and was reviewed by Drs. 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 included STAT6, ALK, myogenin, MyoD1, and CD163. Per report, the tumor cells demonstrate patchy staining for myogenin and MyoD1, CD163 highlights  admixed histiocytes, and STAT6 and ALK are negative. The complete outside consultation report is available under the Media tab in [place], and a copy is retained in the pathology department.   Original comment: The biopsies demonstrate a predominantly spindle cell neoplasm with moderate nuclear pleomorphism with occasional cells having a rhabdoid/epithelioid appearance with prominent nucleoli. Staghorn vessels are focally present. No necrosis is present, and mitotic activity is not appreciated (0 mitoses in 10 hpf). The neoplastic cells are positive for desmin and weakly positive for smooth muscle actin, and rare cells are positive for EMA and p16. Focal areas demonstrate abundant cells with vacuolated cytoplasm which are immunopositive for CD68, consistent with macrophages; however, the neoplastic cells also demonstrate weak positivity for CD68. The neoplastic cells are negative for cytokeratin AE1/AE3, S100, muscle specific actin, and CD34. The Ki-67 labeling index is low (<1%). FISH for MDM2 was performed and was negative. Overall, the tumor demonstrates some degree of rhabdoid morphology and immunopositivity for desmin and smooth muscle actin which suggest possible muscle differentiation and demonstrates at least moderate nuclear pleomorphism, while lacking overtly malignant features such as necrosis and mitotic activity. Staghorn vessels raise the possibility of solitary fibrous tumor; however, the neoplastic cells are negative for CD34. The specimen is being sent to [facility] for additional testing (STAT6) and expert consultation with additional testing as needed, and the results will be reported in an addendum or amended report when available.  The findings were discussed with Dr. Redmond Pulling by phone on 07/07/19.  This case was also reviewed by Dr. Charna Archer, who concurs with the diagnosis.  The positive immunohistochemical controls worked appropriately.

## 2019-08-17 NOTE — Progress Notes (Addendum)
Discussed MR lumbar spine with radiologist, Dr. Zigmund Daniel -?right sacroilitis vs myositis of right psoas/iliacus -consult IR to see if biopsy can be obtained to send for culture -may need ID consult -as pt is hemodynamically stable, hold off abx to maximize cultures -ESR -CRP  DTat

## 2019-08-17 NOTE — Progress Notes (Signed)
PROGRESS NOTE  Clinton Davis [id number] DOB: Nov 26, 1980 DOA: 08/16/2019 PCP: Seward Carol, MD  Brief History:  38 y.o. 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 Pain started initially on the left leg and left pelvic area about 2 weeks ago but gradually seemed to have migrated to his right buttock, pelvic and posterior thigh. Also has pain to right inguinal area. Pain is worse with weight bearing, ambulating and internally rotating his leg. Has difficult with walking short distances. Pain became more severe about 5 days ago and he started taking old prescriptions of hydrocodone and Tylenol at home.  Saw his PCP 2 days ago and was also started on tramadol.  He denies any recent injury or trauma to the right leg.  He states that some of the pain is also originating in the lower lumbar area.  He denies any recent injuries or falls.  He has not noted any redness or swelling. Patient states that he has been having intermittent fevers up to 1 to 1.0 F at home.  He states that this has been occurring since June 2020.  He denies any acute or worsening headache, neck pain, coughing, hemoptysis, chest pain, shortness breath, nausea, vomiting, diarrhea.  Has some intermittent dysuria.  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  Patient also endorsed on and off fever since May.  Has been tested negative for COVID x3 - Suspect this is due to tumor burden.  Chest x-ray negative for any acute findings.  Negative CT pelvis and lumbar spine. Negative right shoulder X-ray. -Suspect neoplastic fever -Follow blood cultures -Urinalysis negative for pyuria  Anemia of chronic disease -Iron saturation 8% -Ferritin 1250 -B12 349  Lung nodules -Rounded nodules in right lung is enlarged compared to previous CT on 07/28/2019, concerning for enlarging metastatic nodules - Appreciate oncology recommendations    Disposition Plan:   Home in 2-3 days  Family Communication:  No Family at bedside  Consultants:  Med/onc--EDP spoke with Magrinat  Code Status:  FULL   DVT Prophylaxis:[place]man Lovenox   Procedures: As Listed in Progress Note Above  Antibiotics: None       Subjective:  Patient denies fevers, chills, headache, chest pain, dyspnea, nausea, vomiting, diarrhea, abdominal pain, dysuria, hematuria, hematochezia, and melena.  Objective: Vitals:   08/17/19 0100 08/17/19 0200 08/17/19 0403 08/17/19 0506  BP: 103/62 101/61 97/63 113/65  Pulse: 87 90 85 85  Resp:   18 18  Temp:   98.7 F (37.1 C)   TempSrc:   Oral   SpO2: 97% 97% 96% 98%    Intake/Output Summary (Last 24 hours) at 08/17/2019 0830 Last data filed at 08/16/2019 2056 Gross per 24 hour  Intake 1000 ml  Output --  Net 1000 ml   Weight change:  Exam:   General:  Pt is alert, follows commands appropriately, not in acute distress  HEENT: No icterus, No thrush, No neck mass, [place]/AT  Cardiovascular: RRR, S1/S2, no rubs, no gallops  Respiratory: CTA bilaterally, no wheezing, no crackles, no rhonchi  Abdomen: Soft/+BS, non tender, non distended, no guarding  Extremities: No  edema, No lymphangitis, No petechiae, No rashes, no synovitis; mild lumbosacral pain.  No erythema in the right or left buttock or draining wounds.   Data Reviewed: I have personally reviewed following labs and imaging studies Basic Metabolic Panel: Recent Labs  Lab 08/16/19 1230  NA 135  K 4.4  CL 99  CO2 24  GLUCOSE 103*  BUN 10  CREATININE 0.84  CALCIUM 9.1   Liver Function Tests: Recent Labs  Lab 08/16/19 1230  AST 23  ALT 48*  ALKPHOS 213*  BILITOT 0.7  PROT 7.6  ALBUMIN 2.6*   No results for input(s): LIPASE, AMYLASE in the last 168 hours. No results for input(s): AMMONIA in the last 168 hours. Coagulation Profile: No results for input(s): INR, PROTIME in the last 168 hours. CBC: Recent Labs  Lab 08/16/19 1230  WBC 20.7*  NEUTROABS 17.0*  HGB 9.4*  HCT 31.1*  MCV 80.6  PLT 508*   Cardiac Enzymes: No results for input(s): CKTOTAL, CKMB, CKMBINDEX, TROPONINI in the last 168 hours. BNP: Invalid input(s): POCBNP CBG: No results for input(s): GLUCAP in the last 168 hours. HbA1C: No results for input(s): HGBA1C in the last 72 hours. Urine analysis:    Component Value Date/Time   COLORURINE YELLOW 08/16/2019 1200   APPEARANCEUR CLEAR 08/16/2019 1200   LABSPEC 1.011 08/16/2019 1200   PHURINE 6.0 08/16/2019 1200   GLUCOSEU NEGATIVE 08/16/2019 1200   HGBUR MODERATE (A) 08/16/2019 1200   BILIRUBINUR NEGATIVE 08/16/2019 1200   KETONESUR NEGATIVE 08/16/2019 1200   PROTEINUR NEGATIVE 08/16/2019 1200   NITRITE NEGATIVE 08/16/2019 1200   LEUKOCYTESUR NEGATIVE 08/16/2019 1200   Sepsis Labs: @LABRCNTIP (procalcitonin:4,lacticidven:4) ) Recent Results (from the past 240 hour(s))  Culture, blood (routine x 2)     Status: None (Preliminary result)   Collection Time: 08/16/19  8:20 PM   Specimen: BLOOD  Result Value Ref Range Status   Specimen Description BLOOD LEFT ANTECUBITAL  Final   Special Requests   Final    BOTTLES DRAWN AEROBIC AND ANAEROBIC Blood  Culture adequate volume   Culture   Final    NO GROWTH < 12 HOURS Performed at [facility], [place] [address] [place], [place] [address]    Report Status PENDING  Incomplete  Culture, blood (routine x 2)     Status: None (Preliminary result)   Collection Time: 08/16/19  8:25 PM   Specimen: BLOOD RIGHT HAND  Result Value Ref Range Status   Specimen Description BLOOD RIGHT HAND  Final   Special Requests   Final    BOTTLES DRAWN AEROBIC AND ANAEROBIC Blood Culture results may not be optimal due to an inadequate volume of blood received in culture bottles   Culture   Final    NO GROWTH < 12 HOURS Performed at [facility], [place] [address], [place],  [address]    Report Status PENDING  Incomplete  SARS CORONAVIRUS 2 (Carmyn Hamm 6-24 HRS) Nasopharyngeal Nasopharyngeal Swab     Status: None   Collection Time: 08/16/19  9:04 PM   Specimen: Nasopharyngeal Swab  Result Value Ref Range Status   SARS Coronavirus 2 NEGATIVE NEGATIVE Final  Comment: (NOTE) SARS-CoV-2 target nucleic acids are NOT DETECTED. The SARS-CoV-2 RNA is generally detectable in upper and lower respiratory specimens during the acute phase of infection. Negative results do not preclude SARS-CoV-2 infection, do not rule out co-infections with other pathogens, and should not be used as the sole basis for treatment or other patient management decisions. Negative results must be combined with clinical observations, patient history, and epidemiological information. The expected result is Negative. Fact Sheet for Patients: SugarRoll.be Fact Sheet for Healthcare Providers: https://www.woods-mathews.com/ This test is not yet approved or cleared by the [place] FDA and  has been authorized for detection and/or diagnosis of SARS-CoV-2 by FDA under an Emergency Use Authorization (EUA). This EUA will remain  in effect (meaning this test can be used) for the duration of  the COVID-19 declaration under Section 56 4(b)(1) of the Act, 21 U.S.C. section 360bbb-3(b)(1), unless the authorization is terminated or revoked sooner. Performed at [facility], [place] [address], [place], [place] [address]      Scheduled Meds:  enoxaparin (LOVENOX) injection  40 mg Subcutaneous Q24H   oxyCODONE-acetaminophen  1 tablet Oral Q4H   Continuous Infusions:  Procedures/Studies: Dg Chest 2 View  Result Date: 08/16/2019 CLINICAL DATA:  Right hip pain, weakness, fever EXAM: CHEST - 2 VIEW COMPARISON:  Chest radiograph, 06/08/2019, CT chest, 07/28/2019 FINDINGS: The heart size and mediastinal contours are within normal limits. Rounded nodules in the right lung appear substantially enlarged compared to CT dated 07/28/2019 and are new in comparison to chest radiographs dated 06/08/2019. The visualized skeletal structures are unremarkable. IMPRESSION: 1.  No acute abnormality of the lungs. 2. Rounded nodules in the right lung appear substantially enlarged compared to CT dated 07/28/2019 and are new in comparison to chest radiographs dated 06/08/2019, concerning for enlarging metastatic nodules. Electronically Signed   By: Eddie Candle M.D.   On: 08/16/2019 13:15   Dg Lumbar Spine Complete  Result Date: 08/16/2019 CLINICAL DATA:  Low back pain with right-sided radicular symptoms EXAM: LUMBAR SPINE - COMPLETE 4+ VIEW COMPARISON:  None. FINDINGS: Frontal, lateral, spot lumbosacral lateral, and bilateral oblique views were obtained. There are 5 non-rib-bearing lumbar type vertebral bodies. There is no fracture or spondylolisthesis. There is moderate narrowing at L5-S1. Other disc spaces appear unremarkable. There is no appreciable facet arthropathy. IMPRESSION: Disc space narrowing at L5-S1. Other disc spaces appear unremarkable. No appreciable arthropathy. No fracture or spondylolisthesis. Electronically Signed   By: Lowella Grip III M.D.   On: 08/16/2019 10:30   Dg Shoulder  Right  Result Date: 08/16/2019 CLINICAL DATA:  Pain EXAM: RIGHT SHOULDER - 2+ VIEW COMPARISON:  Chest CT July 28, 2019 FINDINGS: Oblique and Y scapular images were obtained. No evident fracture or dislocation. Joint spaces appear normal. No erosive change or intra-articular calcification. Nodular opacity right upper lobe documented on recent CT is apparent by radiography. IMPRESSION: No fracture or dislocation. No evident arthropathy. Nodular opacity right upper lobe, documented on recent chest CT. Electronically Signed   By: Lowella Grip III M.D.   On: 08/16/2019 10:32   Ct Chest W Contrast  Result Date: 07/28/2019 CLINICAL DATA:  Anemia with 20 pound weight loss over the past several months as well as cough. EXAM: CT CHEST WITH CONTRAST TECHNIQUE: Multidetector CT imaging of the chest was performed during intravenous contrast administration. CONTRAST:  26mL ISOVUE-300 IOPAMIDOL (ISOVUE-300) INJECTION 61% COMPARISON:  CT abdomen/pelvis 06/11/2019 FINDINGS: Cardiovascular: Borderline cardiomegaly. Thoracic aorta is normal. Pulmonary arterial system is within normal.  Remaining vascular structures are normal. Mediastinum/Nodes: No evidence of mediastinal or hilar adenopathy. Remaining mediastinal structures are normal. Lungs/Pleura: Lungs are adequately inflated without focal airspace consolidation or effusion. There are several bilateral lung nodules with the largest over the posterior right upper lobe abutting the pleura measuring 1.2 x 1.4 cm. Total of 4 nodules within the right lung and a single 2 mm nodule over the left lung in the lingula. These nodules are predominantly well-defined rounded. Airways are normal. Upper Abdomen: No acute findings. No evidence of free fluid or adenopathy. Musculoskeletal: Minimal degenerative change of the spine. IMPRESSION: 1.  No acute cardiopulmonary disease. 2. Four right lung nodules and single small left lung nodule. Largest nodule measures 1.4 cm in greatest  diameter over the posterior right upper lobe. Findings are concerning for metastatic disease in light of patient's clinical history and known large left thigh mass seen on previous CT which may represent primary sarcomatous lesion. Recommend clinical correlation. Electronically Signed   By: Marin Olp M.D.   On: 07/28/2019 13:39   Ct Pelvis W Contrast  Result Date: 08/16/2019 CLINICAL DATA:  Right gluteal pain for several days. No reported injury. Clinical concern for myositis or abscess. EXAM: CT PELVIS WITH CONTRAST TECHNIQUE: Multidetector CT imaging of the pelvis was performed using the standard protocol following the bolus administration of intravenous contrast. CONTRAST:  140mL OMNIPAQUE IOHEXOL 300 MG/ML  SOLN COMPARISON:  08/16/2019 pelvic and right hip radiographs. FINDINGS: Urinary Tract:  Normal bladder. Normal caliber pelvic ureters. Bowel:  Normal caliber pelvic bowel loops. No bowel wall thickening. Vascular/Lymphatic: Mild left iliac atherosclerosis. No acute vascular abnormality. No pathologically enlarged pelvic lymph nodes. Reproductive:  Normal size prostate. Other: No pelvic ascites, pneumoperitoneum or focal fluid collection. Tiny umbilical fat containing hernia. Musculoskeletal: No aggressive appearing focal osseous lesions. There is a large 11.6 x 7.4 x 18.8 cm intramuscular mass in the anterolateral proximal left thigh (series 3/image 63) with irregular poorly defined margins and markedly heterogeneous internal soft tissue density with hypodense and hyperdense components, the inferior extent of which is not visualized on this CT. No periosteal reaction or erosive changes in the adjacent proximal left femur. No gluteal region mass or fluid collection. IMPRESSION: 1. Large 11.6 x 7.4 x 18.8 cm intramuscular mass in the anterolateral proximal left thigh, with irregular poorly defined margins and markedly heterogeneous internal soft tissue density with hypodense and hyperdense components.  Soft tissue sarcoma is the diagnosis of exclusion. MRI of the left thigh without and with IV contrast is strongly recommended for further characterization. 2. No pelvic lymphadenopathy. Electronically Signed   By: Ilona Sorrel M.D.   On: 08/16/2019 14:30   Dg Hip Unilat W Or Wo Pelvis 2-3 Views Right  Result Date: 08/16/2019 CLINICAL DATA:  Progressive pain EXAM: DG HIP (WITH OR WITHOUT PELVIS) 2-3V RIGHT COMPARISON:  None. FINDINGS: Frontal pelvis as well as frontal and lateral right hip images were obtained. No fracture or dislocation. There is slight narrowing of each hip joint. There is mild bony overgrowth along each lateral acetabulum. No erosive changes. Sacroiliac joints appear unremarkable bilaterally. A small bone island is noted in the right femoral neck. IMPRESSION: No fracture or dislocation. Symmetric bony overgrowth along each lateral acetabulum. This finding places patient at increased risk for femoroacetabular syndrome. There is mild symmetric narrowing of each hip joint. Electronically Signed   By: Lowella Grip III M.D.   On: 08/16/2019 10:29   Vas [place] Lower Extremity Venous (dvt) (only [facility])  Result Date: 08/16/2019  Lower Venous Study Indications: Buttock pain radiating down leg.  Comparison Study: no prior Performing Technologist: June Leap RDMS, RVT  Examination Guidelines: A complete evaluation includes B-mode imaging, spectral Doppler, color Doppler, and power Doppler as needed of all accessible portions of each vessel. Bilateral testing is considered an integral part of a complete examination. Limited examinations for reoccurring indications may be performed as noted.  +---------+---------------+---------+-----------+----------+--------------+  RIGHT     Compressibility Phasicity Spontaneity Properties Thrombus Aging  +---------+---------------+---------+-----------+----------+--------------+  CFV       Full            Yes       Yes                                     +---------+---------------+---------+-----------+----------+--------------+  SFJ       Full                                                             +---------+---------------+---------+-----------+----------+--------------+  FV Prox   Full                                                             +---------+---------------+---------+-----------+----------+--------------+  FV Mid    Full                                                             +---------+---------------+---------+-----------+----------+--------------+  FV Distal Full                                                             +---------+---------------+---------+-----------+----------+--------------+  PFV       Full                                                             +---------+---------------+---------+-----------+----------+--------------+  POP       Full            Yes       Yes                                    +---------+---------------+---------+-----------+----------+--------------+  PTV       Full                                                             +---------+---------------+---------+-----------+----------+--------------+  PERO      Full                                                             +---------+---------------+---------+-----------+----------+--------------+     Summary: Right: There is no evidence of deep vein thrombosis in the lower extremity. No cystic structure found in the popliteal fossa.  *See table(s) above for measurements and observations. Electronically signed by Monica Martinez MD on 08/16/2019 at 5:05:37 PM.    Final     Orson Eva, DO  [facility] Pager [phone]  If 7PM-7AM, please contact night-coverage [email or web address] Password [email or web address] 08/17/2019, 8:30 AM   LOS: 1 day

## 2019-08-17 NOTE — ED Notes (Signed)
Pt assisted to standing to get back in bed after using urinal. Requesting more toradol as he had better relieve with it earlier. Text page sent to provider.

## 2019-08-18 ENCOUNTER — Inpatient Hospital Stay (HOSPITAL_COMMUNITY): Payer: BC Managed Care – PPO

## 2019-08-18 ENCOUNTER — Encounter (HOSPITAL_COMMUNITY): Payer: Self-pay | Admitting: General Practice

## 2019-08-18 DIAGNOSIS — C4922 Malignant neoplasm of connective and soft tissue of left lower limb, including hip: Secondary | ICD-10-CM

## 2019-08-18 DIAGNOSIS — R509 Fever, unspecified: Secondary | ICD-10-CM

## 2019-08-18 DIAGNOSIS — M79604 Pain in right leg: Secondary | ICD-10-CM

## 2019-08-18 DIAGNOSIS — R52 Pain, unspecified: Secondary | ICD-10-CM | POA: Diagnosis not present

## 2019-08-18 DIAGNOSIS — M25551 Pain in right hip: Secondary | ICD-10-CM

## 2019-08-18 DIAGNOSIS — M461 Sacroiliitis, not elsewhere classified: Secondary | ICD-10-CM

## 2019-08-18 LAB — BASIC METABOLIC PANEL
Anion gap: 10 (ref 5–15)
BUN: 13 mg/dL (ref 6–20)
CO2: 26 mmol/L (ref 22–32)
Calcium: 8.9 mg/dL (ref 8.9–10.3)
Chloride: 103 mmol/L (ref 98–111)
Creatinine, Ser: 0.94 mg/dL (ref 0.61–1.24)
GFR calc Af Amer: >60 mL/min (ref >60)
GFR calc non Af Amer: >60 mL/min (ref >60)
Glucose, Bld: 102 mg/dL — ABNORMAL HIGH (ref 70–99)
Potassium: 4.5 mmol/L (ref 3.5–5.1)
Sodium: 139 mmol/L (ref 135–145)

## 2019-08-18 LAB — CBC
HCT: 28.4 % — ABNORMAL LOW (ref 39.0–52.0)
Hemoglobin: 8.5 g/dL — ABNORMAL LOW (ref 13.0–17.0)
MCH: 24.3 pg — ABNORMAL LOW (ref 26.0–34.0)
MCHC: 29.9 g/dL — ABNORMAL LOW (ref 30.0–36.0)
MCV: 81.1 fL (ref 80.0–100.0)
Platelets: 491 10*3/uL — ABNORMAL HIGH (ref 150–400)
RBC: 3.5 MIL/uL — ABNORMAL LOW (ref 4.22–5.81)
RDW: 15.9 % — ABNORMAL HIGH (ref 11.5–15.5)
WBC: 15.6 10*3/uL — ABNORMAL HIGH (ref 4.0–10.5)
nRBC: 0 % (ref 0.0–0.2)

## 2019-08-18 LAB — PROTIME-INR
INR: 1.4 — ABNORMAL HIGH (ref 0.8–1.2)
Prothrombin Time: 16.5 seconds — ABNORMAL HIGH (ref 11.4–15.2)

## 2019-08-18 LAB — CK: Total CK: 30 U/L — ABNORMAL LOW (ref 49–397)

## 2019-08-18 LAB — FOLATE RBC
Folate, Hemolysate: 339 ng/mL
Folate, RBC: 1202 ng/mL (ref >498)
Hematocrit: 28.2 % — ABNORMAL LOW (ref 37.5–51.0)

## 2019-08-18 LAB — C-REACTIVE PROTEIN: CRP: 21.8 mg/dL — ABNORMAL HIGH (ref <1.0)

## 2019-08-18 LAB — SEDIMENTATION RATE: Sed Rate: 111 mm/hr — ABNORMAL HIGH (ref 0–16)

## 2019-08-18 MED ORDER — VANCOMYCIN HCL 10 G IV SOLR
1750.0000 mg | Freq: Once | INTRAVENOUS | Status: DC
  Filled 2019-08-18: qty 1750

## 2019-08-18 MED ORDER — ENOXAPARIN SODIUM 40 MG/0.4ML ~~LOC~~ SOLN
40.0000 mg | SUBCUTANEOUS | Status: DC
  Filled 2019-08-18: qty 0.4

## 2019-08-18 MED ORDER — SODIUM CHLORIDE 0.9 % IV SOLN
2.0000 g | Freq: Three times a day (TID) | INTRAVENOUS | Status: DC
  Filled 2019-08-18 (×2): qty 2

## 2019-08-18 MED ORDER — VANCOMYCIN HCL 10 G IV SOLR: 1250.0000 mg | Freq: Two times a day (BID) | INTRAVENOUS | Status: DC

## 2019-08-18 MED ORDER — GADOBUTROL 1 MMOL/ML IV SOLN
8.0000 mL | Freq: Once | INTRAVENOUS | Status: AC | PRN
  Administered 2019-08-18: 09:00:00 8 mL via INTRAVENOUS

## 2019-08-18 NOTE — Plan of Care (Signed)

## 2019-08-18 NOTE — Plan of Care (Signed)
  Problem: Clinical Measurements: Goal: Diagnostic test results will improve Outcome: Progressing   Problem: Activity: Goal: Risk for activity intolerance will decrease Outcome: Progressing   Problem: Pain Managment: Goal: General experience of comfort will improve Outcome: Progressing   

## 2019-08-18 NOTE — Progress Notes (Signed)
Pharmacy Antibiotic Note  Clinton Davis is a 38 y.o. male admitted on 08/16/2019 with pelvic/buttock pain and fevers. MRI pelvis shows potential osteomyelitis. Pharmacy has been consulted for vancomycin and cefepime dosing. WBC up, ESR/CRP elevated, blood cultures no growth thus far, Cr stable ~4m/dl.  Plan: -Cefepime 2g IV q8h -Vancomycin 17575mIV x1 then 125057mV q12h - est AUC 470 -Vancomycin levels as Css -Follow cultures, renal function   Height: 5' 8"  (172.7 cm) Weight: 184 lb 4.8 oz (83.6 kg)(scale a) IBW/kg (Calculated) : 68.4  Temp (24hrs), Avg:98.4 F (36.9 C), Min:98.2 F (36.8 C), Max:98.7 F (37.1 C)  Recent Labs  Lab 08/16/19 1230 08/17/19 1223 08/18/19 0455  WBC 20.7* 17.2* 15.6*  CREATININE 0.84 0.99 0.94    Estimated Creatinine Clearance: 110.1 mL/min (by C-G formula based on SCr of 0.94 mg/dL).    No Known Allergies  Antimicrobials this admission: Cefepime 10/27 >>  Vancomycin 10/27 >>   Dose adjustments this admission: none  Microbiology results: 10/25 BCx: NGTD   Thank you for allowing pharmacy to be a part of this patient's care.   MicArrie SenateharmD, BCPS Clinical Pharmacist [phone] check AMION for all [facility] 08/18/2019

## 2019-08-18 NOTE — Progress Notes (Signed)
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 Pain started initially on the left leg and left pelvic area about 2 weeks ago but gradually seemed to have migrated to his right buttock, pelvic and posterior thigh. Also has pain to right inguinal area. Pain is worse with weight bearing, ambulating and internally rotating his leg. Has difficult with walking short distances. Pain became more severe about 5 days ago and he started taking oldprescriptions of hydrocodone and Tylenol at home. Saw his PCP 2 days ago and was also started on tramadol.  He denies any recent injury or trauma to the right leg.  He states that some of the pain is also originating in the lower lumbar area.  He denies any recent injuries or falls.  He has not noted any redness or swelling. Patient states that he has been having intermittent fevers up to 101.0 F at home.  He states that this has been occurring since June 2020.  He denies any acute or worsening headache, neck pain, coughing, hemoptysis, chest pain, shortness breath, nausea, vomiting, diarrhea.  Has some intermittent dysuria.  Assessment & Plan:   Active Problems:   Right leg pain  Uncontrolled right pelvic/ischial/lumbosacral pain -Venous duplex right lower extremity negative for DVT -CT pelvis negative for acute etiology -Oncology has been consulted and has seen pt per below -Continue with analgesia as tolerated -MRI lumbar spine and pelvis reviewed. Findings worrisome and characteristic of osteomyelitis and septic arthirits involving SI joint, R sacrum and adjacent iliac bone -Pt was referred to IR for possible aspiration, however, no drainable collection seen -Will start pt  empirically on vanc and cefepime. 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 Patient also endorsed on and off fever since May.Has been tested negative for COVID x3 - Possible secondary to tumor burden vs above concerns of osteo -Chest x-ray negative for any acute findings.  -Follow blood cultures -Urinalysis negative for pyuria  Anemia of chronic disease -Iron saturation 8% -Ferritin 1250 -B12 349  Lung nodules -Rounded nodules in right lung is enlarged compared to previous CT on 07/28/2019, concerning for enlarging metastatic nodules - Appreciate oncology recommendations, noted  DVT prophylaxis: Lovenox subQ Code Status: Full Family Communication: Pt in room, family at bedside Disposition Plan: Uncertain at this time  Consultants:   Oncology  IR  Procedures:     Antimicrobials: Anti-infectives (From admission, onward)   None       Subjective: Complaining of continued low back pain  Objective: Vitals:   08/17/19 2004 08/18/19 0106 08/18/19 0501 08/18/19 1309  BP: (!) 108/59 (!) 91/52 112/80 105/62  Pulse: 91 91 80 79  Resp: 16 14 16 18   Temp: 98.4 F (36.9 C) 98.7 F (37.1 C) 98.2 F (36.8 C) 98.2 F (36.8 C)  TempSrc: Oral Oral Oral Oral  SpO2:  96% 94% 97% 95%  Weight:   83.6 kg   Height:        Intake/Output Summary (Last 24 hours) at 08/18/2019 1319 Last data filed at 08/18/2019 1130 Gross per 24 hour  Intake 360 ml  Output 500 ml  Net -140 ml   Filed Weights    08/17/19 0849 08/18/19 0501  Weight: 83.1 kg 83.6 kg    Examination:  General exam: Appears uncomfortable while sitting Respiratory system: Clear to auscultation. Respiratory effort normal. Cardiovascular system: S1 & S2 heard, RRR Gastrointestinal system: Abdomen is nondistended, soft and nontender. No organomegaly or masses felt. Normal bowel sounds heard. Central nervous system: Alert and oriented. No focal neurological deficits. Extremities: Symmetric 5 x 5 power. Tenderness over R side of low back on mod palpation Skin: No rashes, lesions Psychiatry: Judgement and insight appear normal. Mood & affect appropriate.   Data Reviewed: I have personally reviewed following labs and imaging studies  CBC: Recent Labs  Lab 08/16/19 1230 08/17/19 1223 08/18/19 0455  WBC 20.7* 17.2* 15.6*  NEUTROABS 17.0*  --   --   HGB 9.4* 8.2* 8.5*  HCT 31.1* 27.0* 28.4*  MCV 80.6 79.6* 81.1  PLT 508* 454* [id number]*   Basic Metabolic Panel: Recent Labs  Lab 08/16/19 1230 08/17/19 1223 08/18/19 0455  NA 135 139 139  K 4.4 4.3 4.5  CL 99 104 103  CO2 24 25 26   GLUCOSE 103* 133* 102*  BUN 10 10 13   CREATININE 0.84 0.99 0.94  CALCIUM 9.1 8.8* 8.9   GFR: Estimated Creatinine Clearance: 110.1 mL/min (by C-G formula based on SCr of 0.94 mg/dL). Liver Function Tests: Recent Labs  Lab 08/16/19 1230  AST 23  ALT 48*  ALKPHOS 213*  BILITOT 0.7  PROT 7.6  ALBUMIN 2.6*   No results for input(s): LIPASE, AMYLASE in the last 168 hours. No results for input(s): AMMONIA in the last 168 hours. Coagulation Profile: Recent Labs  Lab 08/18/19 0455  INR 1.4*   Cardiac Enzymes: Recent Labs  Lab 08/18/19 0455  CKTOTAL 30*   BNP (last 3 results) No results for input(s): PROBNP in the last 8760 hours. HbA1C: No results for input(s): HGBA1C in the last 72 hours. CBG: No results for input(s): GLUCAP in the last 168 hours. Lipid Profile: No results for input(s): CHOL, HDL, LDLCALC, TRIG,  CHOLHDL, LDLDIRECT in the last 72 hours. Thyroid Function Tests: No results for input(s): TSH, T4TOTAL, FREET4, T3FREE, THYROIDAB in the last 72 hours. Anemia Panel: Recent Labs    08/16/19 2020  VITAMINB12 349  FERRITIN 1,250*  TIBC 169*  IRON 13*   Sepsis Labs: No results for input(s): PROCALCITON, LATICACIDVEN in the last 168 hours.  Recent Results (from the past 240 hour(s))  Culture, blood (routine x 2)     Status: None (Preliminary result)   Collection Time: 08/16/19  8:20 PM   Specimen: BLOOD  Result Value Ref Range Status   Specimen Description BLOOD LEFT ANTECUBITAL  Final   Special Requests   Final    BOTTLES DRAWN AEROBIC AND ANAEROBIC Blood Culture adequate volume   Culture   Final    NO GROWTH 2 DAYS Performed at [facility], [address], [place], [place] [address]    Report Status PENDING  Incomplete  Culture, blood (routine x 2)     Status: None (Preliminary result)   Collection Time: 08/16/19  8:25 PM   Specimen: BLOOD RIGHT HAND  Result Value Ref Range Status  Specimen Description BLOOD RIGHT HAND  Final   Special Requests   Final    BOTTLES DRAWN AEROBIC AND ANAEROBIC Blood Culture results may not be optimal due to an inadequate volume of blood received in culture bottles   Culture   Final    NO GROWTH 2 DAYS Performed at [facility], [place] [address] [place], [place] [address]    Report Status PENDING  Incomplete  SARS CORONAVIRUS 2 (TAT 6-24 HRS) Nasopharyngeal Nasopharyngeal Swab     Status: None   Collection Time: 08/16/19  9:04 PM   Specimen: Nasopharyngeal Swab  Result Value Ref Range Status   SARS Coronavirus 2 NEGATIVE NEGATIVE Final    Comment: (NOTE) SARS-CoV-2 target nucleic acids are NOT DETECTED. The SARS-CoV-2 RNA is generally detectable in upper and lower respiratory specimens during the acute phase of infection. Negative results do not preclude SARS-CoV-2 infection, do not rule out co-infections with other pathogens,  and should not be used as the sole basis for treatment or other patient management decisions. Negative results must be combined with clinical observations, patient history, and epidemiological information. The expected result is Negative. Fact Sheet for Patients: SugarRoll.be Fact Sheet for Healthcare Providers: https://www.woods-mathews.com/ This test is not yet approved or cleared by the [place] FDA and  has been authorized for detection and/or diagnosis of SARS-CoV-2 by FDA under an Emergency Use Authorization (EUA). This EUA will remain  in effect (meaning this test can be used) for the duration of the COVID-19 declaration under Section 56 4(b)(1) of the Act, 21 U.S.C. section 360bbb-3(b)(1), unless the authorization is terminated or revoked sooner. Performed at [facility], [place] [address], [place], [place] [address]      Radiology Studies: Ct Pelvis W Contrast  Result Date: 08/16/2019 CLINICAL DATA:  Right gluteal pain for several days. No reported injury. Clinical concern for myositis or abscess. EXAM: CT PELVIS WITH CONTRAST TECHNIQUE: Multidetector CT imaging of the pelvis was performed using the standard protocol following the bolus administration of intravenous contrast. CONTRAST:  144mL OMNIPAQUE IOHEXOL 300 MG/ML  SOLN COMPARISON:  08/16/2019 pelvic and right hip radiographs. FINDINGS: Urinary Tract:  Normal bladder. Normal caliber pelvic ureters. Bowel:  Normal caliber pelvic bowel loops. No bowel wall thickening. Vascular/Lymphatic: Mild left iliac atherosclerosis. No acute vascular abnormality. No pathologically enlarged pelvic lymph nodes. Reproductive:  Normal size prostate. Other: No pelvic ascites, pneumoperitoneum or focal fluid collection. Tiny umbilical fat containing hernia. Musculoskeletal: No aggressive appearing focal osseous lesions. There is a large 11.6 x 7.4 x 18.8 cm intramuscular mass in the anterolateral  proximal left thigh (series 3/image 63) with irregular poorly defined margins and markedly heterogeneous internal soft tissue density with hypodense and hyperdense components, the inferior extent of which is not visualized on this CT. No periosteal reaction or erosive changes in the adjacent proximal left femur. No gluteal region mass or fluid collection. IMPRESSION: 1. Large 11.6 x 7.4 x 18.8 cm intramuscular mass in the anterolateral proximal left thigh, with irregular poorly defined margins and markedly heterogeneous internal soft tissue density with hypodense and hyperdense components. Soft tissue sarcoma is the diagnosis of exclusion. MRI of the left thigh without and with IV contrast is strongly recommended for further characterization. 2. No pelvic lymphadenopathy. Electronically Signed   By: Ilona Sorrel M.D.   On: 08/16/2019 14:30   Mr Lumbar Spine W Wo Contrast  Result Date: 08/17/2019 CLINICAL DATA:  Low back pain and right hip and leg pain. Buttock and  posterior thigh pain. 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 Right inguinal pain. Radiculopathy. EXAM: MRI LUMBAR SPINE WITHOUT AND WITH CONTRAST TECHNIQUE: Multiplanar and multiecho pulse sequences of the lumbar spine were obtained without and with intravenous contrast. CONTRAST:  13mL GADAVIST GADOBUTROL 1 MMOL/ML IV SOLN COMPARISON:  CT scan of the pelvis dated 08/16/2019 and lumbar radiographs dated 08/06/2019 FINDINGS: Segmentation:  Standard. Alignment:  Physiologic. Vertebrae: There is abnormal very low signal intensity from the vertebra and visualized portion of the sacrum and iliac bones. This could be due to reactivated red marrow or fibrosis. The patient is anemic. There is abnormal edema in the right sacral ala and in the soft tissues around the superior aspect of the right SI joint. There is a small fluid collection anterior to the right SI joint seen on images 44 of series 9 and series 6. There is edema in the deep fibers of  the right iliacus muscle anterior to the psoas muscle. There is edema and abnormal enhancement in the right posterior paraspinal muscles adjacent to the SI joint and extending superiorly in the lumbar spine to the L3 level along the right side of the spinous processes. After contrast administration there is enhancement of the soft tissues around the right SI joint. There is no discrete fracture or mass at that site.The patient does have an elevated white blood count. The possibility of infectious right sacroiliitis should be considered. Conus medullaris and cauda equina: Conus extends to the L1-2 level. Conus and cauda equina appear normal. Paraspinal and other soft tissues: Abnormal edema and enhancement in the right paraspinal soft tissues as described above as well as around the right SI joint. The possibility of infectious right sacroiliitis and myositis and osteomyelitis should be considered. The appearance is not suggestive of metastatic disease or fracture. Disc levels: The discs from T11-12 through L5-S1 are normal. There is no abnormal enhancement within the spinal canal. No evidence of epidural abscess. IMPRESSION: 1. Abnormal edema and enhancement in the right sacral ala and around the right SI joint. The possibility of infectious right sacroiliitis should be considered. 2. Abnormal edema and enhancement in the deep fibers of the right iliacus muscle adjacent to the right psoas muscle. This could be due to inflammation or infection. 3. No evidence of epidural abscess or epidural abscess. 4. Abnormal edema and enhancement in the right posterior paraspinal muscles adjacent to the right SI joint and right side of the spinous processes of the lower lumbar spine extending to the L3 level. I suspect this is secondary to infection. 5. Abnormal low signal intensity of the vertebra and iliac bones which could be due to reactivated red marrow or fibrosis. 6. Critical Value/emergent results were called by telephone  at the time of interpretation on 08/17/2019 at 5:06 pm to providerDAVID TAT , who verbally acknowledged these results. Electronically Signed   By: Lorriane Shire M.D.   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 Right sided pelvic and inguinal pain. EXAM: MRI PELVIS WITHOUT AND WITH CONTRAST TECHNIQUE: Multiplanar multisequence MR imaging of the pelvis was performed both before and after administration of intravenous contrast. CONTRAST:  80mL GADAVIST GADOBUTROL 1 MMOL/ML IV SOLN COMPARISON:  CT scan 08/16/2019 FINDINGS: Urinary Tract:  The bladder is unremarkable. Bowel:  The visualized small bowel and colon are unremarkable. Vascular/Lymphatic: Borderline bilateral inguinal lymph nodes. Reproductive: The prostate gland and  seminal vesicles are unremarkable. Other:  Small amount of presacral fluid is noted. Musculoskeletal: Diffuse abnormal T1 and T2 signal intensity in the left iliac bone anteriorly and subsequent minimal contrast enhancement. There is significant surrounding inflammatory changes involving the iliacus muscle and gluteus minimus muscle. Findings suspicious for osteomyelitis and adjacent myositis. Metastatic disease/infiltrating tumor would be another possibility. Fluid noted in the right SI joint but no significant widening or obvious destructive changes. Diffuse abnormal T1 and T2 signal intensity in the right side of the sacrum and subsequent contrast enhancement. There is also some signal abnormality in the adjacent iliac bone anteriorly and a more rounded appearing lesion in the iliac bone posteriorly. There is adjacent significant inflammatory/interstitial changes involving the right iliopsoas complex along with a possible small, 8 mm abscess along the anterior aspect of the right SI joint. There is also significant inflammation of the piriformis muscle on the right side and some fluid/edema and  inflammation surrounding the operator internus muscle. The low right paravertebral musculature also demonstrate inflammatory changes. I do not see any findings suspicious for discitis or osteomyelitis in the lower lumbar spine. A 10 cm heterogeneously enhancing somewhat necrotic appearing left anterior thigh mass is again demonstrated. IMPRESSION: 1. 10 cm left anterior thigh mass, known spindle-cell sarcoma. 2. Areas of significant osseous and muscular abnormality involving the pelvis as detailed above. I think the findings are much more characteristic of osteomyelitis and septic arthritis than metastatic disease. No discrete drainable abscess is identified. Electronically Signed   By: Marijo Sanes M.D.   On: 08/18/2019 09:36   Vas [place] Lower Extremity Venous (dvt) (only [facility])  Result Date: 08/16/2019  Lower Venous Study Indications: Buttock pain radiating down leg.  Comparison Study: no prior Performing Technologist: June Leap RDMS, RVT  Examination Guidelines: A complete evaluation includes B-mode imaging, spectral Doppler, color Doppler, and power Doppler as needed of all accessible portions of each vessel. Bilateral testing is considered an integral part of a complete examination. Limited examinations for reoccurring indications may be performed as noted.  +---------+---------------+---------+-----------+----------+--------------+  RIGHT     Compressibility Phasicity Spontaneity Properties Thrombus Aging  +---------+---------------+---------+-----------+----------+--------------+  CFV       Full            Yes       Yes                                    +---------+---------------+---------+-----------+----------+--------------+  SFJ       Full                                                             +---------+---------------+---------+-----------+----------+--------------+  FV Prox   Full                                                              +---------+---------------+---------+-----------+----------+--------------+  FV Mid    Full                                                             +---------+---------------+---------+-----------+----------+--------------+  FV Distal Full                                                             +---------+---------------+---------+-----------+----------+--------------+  PFV       Full                                                             +---------+---------------+---------+-----------+----------+--------------+  POP       Full            Yes       Yes                                    +---------+---------------+---------+-----------+----------+--------------+  PTV       Full                                                             +---------+---------------+---------+-----------+----------+--------------+  PERO      Full                                                             +---------+---------------+---------+-----------+----------+--------------+     Summary: Right: There is no evidence of deep vein thrombosis in the lower extremity. No cystic structure found in the popliteal fossa.  *See table(s) above for measurements and observations. Electronically signed by Monica Martinez MD on 08/16/2019 at 5:05:37 PM.    Final     Scheduled Meds:  [START ON 08/19/2019] enoxaparin (LOVENOX) injection  40 mg Subcutaneous Q24H   ketorolac  15 mg Intravenous Q6H   oxyCODONE-acetaminophen  1 tablet Oral Q4H   traMADol  50 mg Oral Q6H   Continuous Infusions:   LOS: 2 days   Marylu Lund, MD [facility] Pager On Amion  If 7PM-7AM, please contact night-coverage 08/18/2019, 1:19 PM

## 2019-08-18 NOTE — Consult Note (Signed)
[name]    Date of Admission:  08/16/2019     Total days of antibiotics 0           Reason for Consult: ?osteomyelitis R sacroiliac joint    Referring Provider: Wyline Copas Primary Care Provider: Seward Carol, MD     Assessment: [place] Davis is a 38 y.o. 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 He says there is a tentative plan for resection on Friday of this week. He over the last 2 weeks has had acute worsening of right hip pain now with inability to bear weight and sit on right buttock due to the pain.   He has continued to have fevers but no longer daily. Radiology read on MRI of the right pelvis concerning for osteomyelitis / septic arthritis. Would like to get more information to validate bacterial/infectious process prior to committing him to antibiotics as this seems like a pretty atypical presentation. For now he is hemodynamically stable and OK to observe off antibiotics while primary team works on pain management and further care. Elevated CRP, fever and other non-specific findings could also be part of malignancy differential.      Plan: 1. Please hold antibiotics 2. Will discuss diagnostic modalities that may be helpful with radiology team.    Active Problems:   Right leg pain   Fever   Sacroiliitis ([place])   . [START ON 08/19/2019] enoxaparin (LOVENOX) injection  40 mg Subcutaneous Q24H  . ketorolac  15 mg Intravenous Q6H  . oxyCODONE-acetaminophen  1 tablet Oral Q4H  . traMADol  50 mg Oral Q6H    HPI: [place] Davis is a 38 y.o. 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   He states that he has been feeling unwell since late May 2020. During this time he began to experience cyclical fevers/fatigue daily in the afternoon at 4 p.m. with temps 100-101 F. This progressed to drenching night sweats and 20 lb weight loss over 2  months from May through July. He noticed further symptoms developing including softening of his bowel movements (not diarrhea but just a change), increased feeling of satiety without eating much food, rash to b/l lower extremities (August 11 & 17th) that resolved on it's own and most recently on October 10th joint pain. Describes the joint pain to involve his whole right buttock/hip and posterior leg that started as intermittent sharp/achy pain with an episode every 3-4 days; this escalated to constant pain that started Tuesday of last week that increased in severity to where he could not stand to bear weight or walk. No falls/injuries to explain this. No travel recently. 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 Has some family close by.   Mass on the thigh was found incidentally on abdominal film per his report as part of work up with his PCP. He states that he failed to realize this was increasing in size but estimates it has been growing for at least 2 years.   He was tested for COVID 3 times with these symptoms and is wondering if he has a "silent infection" and requesting consideration for possible tick borne illness.    Review of Systems: Review of Systems  Constitutional: Positive for diaphoresis, fever  and malaise/fatigue. Negative for chills.  Respiratory: Negative for cough and shortness of breath.   Cardiovascular: Negative for chest pain.  Gastrointestinal: Negative for abdominal pain, diarrhea and vomiting.  Genitourinary: Negative for dysuria.  Musculoskeletal: Positive for joint pain.  Skin: Positive for rash (not since August ).  Neurological: Positive for headaches. Negative for dizziness and weakness.    History reviewed. No pertinent past medical history.  Social History   Tobacco Use  . Smoking status: Never Smoker  . Smokeless tobacco: Never Used   Substance Use Topics  . Alcohol use: No  . Drug use: No    No family history on file. No Known Allergies  OBJECTIVE: Blood pressure 105/62, pulse 79, temperature 98.2 F (36.8 C), temperature source Oral, resp. rate 18, height 5\' 8"  (1.727 m), weight 83.6 kg, SpO2 95 %.  Physical Exam Constitutional:      Comments: Seated in recliner on left buttock d/t pain   HENT:     Mouth/Throat:     Mouth: Mucous membranes are moist.     Pharynx: Oropharynx is clear.  Eyes:     General: No scleral icterus.    Pupils: Pupils are equal, round, and reactive to light.  Cardiovascular:     Rate and Rhythm: Normal rate and regular rhythm.     Heart sounds: No murmur.  Pulmonary:     Effort: Pulmonary effort is normal.     Breath sounds: Normal breath sounds.  Abdominal:     General: Bowel sounds are normal. There is no distension.  Musculoskeletal:       Legs:     Comments: Large tumor with dressing c/d from biopsy  Right hip causing pain and difficulty changing positions; he is able to coordinate standing and repositioning himself with effort.   Skin:    General: Skin is warm and dry.     Capillary Refill: Capillary refill takes less than 2 seconds.  Neurological:     Mental Status: He is alert and oriented to person, place, and time.     Lab Results Lab Results  Component Value Date   WBC 15.6 (H) 08/18/2019   HGB 8.5 (L) 08/18/2019   HCT 28.4 (L) 08/18/2019   MCV 81.1 08/18/2019   PLT 491 (H) 08/18/2019    Lab Results  Component Value Date   CREATININE 0.94 08/18/2019   BUN 13 08/18/2019   NA 139 08/18/2019   K 4.5 08/18/2019   CL 103 08/18/2019   CO2 26 08/18/2019    Lab Results  Component Value Date   ALT 48 (H) 08/16/2019   AST 23 08/16/2019   ALKPHOS 213 (H) 08/16/2019   BILITOT 0.7 08/16/2019     Microbiology: Recent Results (from the past 240 hour(s))  Culture, blood (routine x 2)     Status: None (Preliminary result)   Collection Time: 08/16/19  8:20  PM   Specimen: BLOOD  Result Value Ref Range Status   Specimen Description BLOOD LEFT ANTECUBITAL  Final   Special Requests   Final    BOTTLES DRAWN AEROBIC AND ANAEROBIC Blood Culture adequate volume   Culture   Final    NO GROWTH 2 DAYS Performed at [facility], [place] [address], [place], [place] [address]    Report Status PENDING  Incomplete  Culture, blood (routine x 2)     Status: None (Preliminary result)   Collection Time: 08/16/19  8:25 PM   Specimen: BLOOD RIGHT HAND  Result Value Ref  Range Status   Specimen Description BLOOD RIGHT HAND  Final   Special Requests   Final    BOTTLES DRAWN AEROBIC AND ANAEROBIC Blood Culture results may not be optimal due to an inadequate volume of blood received in culture bottles   Culture   Final    NO GROWTH 2 DAYS Performed at [facility], [place] [address], [place], [place] [address]    Report Status PENDING  Incomplete  SARS CORONAVIRUS 2 (TAT 6-24 HRS) Nasopharyngeal Nasopharyngeal Swab     Status: None   Collection Time: 08/16/19  9:04 PM   Specimen: Nasopharyngeal Swab  Result Value Ref Range Status   SARS Coronavirus 2 NEGATIVE NEGATIVE Final    Comment: (NOTE) SARS-CoV-2 target nucleic acids are NOT DETECTED. The SARS-CoV-2 RNA is generally detectable in upper and lower respiratory specimens during the acute phase of infection. Negative results do not preclude SARS-CoV-2 infection, do not rule out co-infections with other pathogens, and should not be used as the sole basis for treatment or other patient management decisions. Negative results must be combined with clinical observations, patient history, and epidemiological information. The expected result is Negative. Fact Sheet for Patients: SugarRoll.be Fact Sheet for Healthcare Providers: https://www.woods-mathews.com/ This test is not yet approved or cleared by the [place] FDA and  has been authorized for detection  and/or diagnosis of SARS-CoV-2 by FDA under an Emergency Use Authorization (EUA). This EUA will remain  in effect (meaning this test can be used) for the duration of the COVID-19 declaration under Section 56 4(b)(1) of the Act, 21 U.S.C. section 360bbb-3(b)(1), unless the authorization is terminated or revoked sooner. Performed at [facility], [place] [address], [place],  [address]     Janene Madeira, MSN, NP-C [facility] [email or web address] Pager: [phone] Office: [phone] [facility]: [phone]

## 2019-08-18 NOTE — Progress Notes (Signed)
  Request seen for possible aspiration/drain.  Images reviewed by Dr. Kathlene Cote and by Dr. Estanislado Pandy.  Appears to be edema, nothing to aspirate/drain.  May resume diet.  WENDY S BLAIR PA-C 08/18/2019 12:51 PM

## 2019-08-19 ENCOUNTER — Encounter (HOSPITAL_COMMUNITY): Payer: Self-pay | Admitting: General Practice

## 2019-08-19 DIAGNOSIS — D638 Anemia in other chronic diseases classified elsewhere: Secondary | ICD-10-CM

## 2019-08-19 DIAGNOSIS — M25551 Pain in right hip: Secondary | ICD-10-CM | POA: Diagnosis not present

## 2019-08-19 DIAGNOSIS — R509 Fever, unspecified: Secondary | ICD-10-CM | POA: Diagnosis not present

## 2019-08-19 DIAGNOSIS — R911 Solitary pulmonary nodule: Secondary | ICD-10-CM

## 2019-08-19 DIAGNOSIS — C499 Malignant neoplasm of connective and soft tissue, unspecified: Secondary | ICD-10-CM

## 2019-08-19 DIAGNOSIS — R52 Pain, unspecified: Secondary | ICD-10-CM | POA: Diagnosis not present

## 2019-08-19 LAB — COMPREHENSIVE METABOLIC PANEL
ALT: 39 U/L (ref 0–44)
AST: 21 U/L (ref 15–41)
Albumin: 2.2 g/dL — ABNORMAL LOW (ref 3.5–5.0)
Alkaline Phosphatase: 239 U/L — ABNORMAL HIGH (ref 38–126)
Anion gap: 9 (ref 5–15)
BUN: 13 mg/dL (ref 6–20)
CO2: 25 mmol/L (ref 22–32)
Calcium: 8.6 mg/dL — ABNORMAL LOW (ref 8.9–10.3)
Chloride: 100 mmol/L (ref 98–111)
Creatinine, Ser: 0.87 mg/dL (ref 0.61–1.24)
GFR calc Af Amer: >60 mL/min (ref >60)
GFR calc non Af Amer: >60 mL/min (ref >60)
Glucose, Bld: 103 mg/dL — ABNORMAL HIGH (ref 70–99)
Potassium: 4.4 mmol/L (ref 3.5–5.1)
Sodium: 134 mmol/L — ABNORMAL LOW (ref 135–145)
Total Bilirubin: 0.5 mg/dL (ref 0.3–1.2)
Total Protein: 6.4 g/dL — ABNORMAL LOW (ref 6.5–8.1)

## 2019-08-19 LAB — CBC
HCT: 27.4 % — ABNORMAL LOW (ref 39.0–52.0)
Hemoglobin: 8.3 g/dL — ABNORMAL LOW (ref 13.0–17.0)
MCH: 24.3 pg — ABNORMAL LOW (ref 26.0–34.0)
MCHC: 30.3 g/dL (ref 30.0–36.0)
MCV: 80.1 fL (ref 80.0–100.0)
Platelets: 481 10*3/uL — ABNORMAL HIGH (ref 150–400)
RBC: 3.42 MIL/uL — ABNORMAL LOW (ref 4.22–5.81)
RDW: 15.9 % — ABNORMAL HIGH (ref 11.5–15.5)
WBC: 14.5 10*3/uL — ABNORMAL HIGH (ref 4.0–10.5)
nRBC: 0 % (ref 0.0–0.2)

## 2019-08-19 MED ORDER — POLYETHYLENE GLYCOL 3350 17 G PO PACK: 17.0000 g | PACK | Freq: Two times a day (BID) | ORAL | Status: DC

## 2019-08-19 MED ORDER — HYDROMORPHONE HCL 1 MG/ML IJ SOLN
1.0000 mg | INTRAMUSCULAR | Status: DC | PRN
  Administered 2019-08-19: 1 mg via INTRAVENOUS
  Filled 2019-08-19: qty 1

## 2019-08-19 MED ORDER — BISACODYL 5 MG PO TBEC: 5.0000 mg | DELAYED_RELEASE_TABLET | Freq: Every day | ORAL | Status: DC | PRN

## 2019-08-19 MED ORDER — SENNA 8.6 MG PO TABS: 1.0000 | ORAL_TABLET | Freq: Every day | ORAL | Status: DC

## 2019-08-19 MED ORDER — ACETAMINOPHEN 325 MG PO TABS
650.0000 mg | ORAL_TABLET | Freq: Four times a day (QID) | ORAL | Status: DC
  Administered 2019-08-19 (×2): 650 mg via ORAL
  Filled 2019-08-19 (×2): qty 2

## 2019-08-19 MED ORDER — OXYCODONE HCL 5 MG PO TABS
10.0000 mg | ORAL_TABLET | ORAL | Status: DC | PRN
  Administered 2019-08-19 (×2): 10 mg via ORAL
  Filled 2019-08-19 (×2): qty 2

## 2019-08-19 MED ORDER — HYDROMORPHONE HCL 1 MG/ML IJ SOLN: 1.0000 mg | INTRAMUSCULAR | Status: DC | PRN

## 2019-08-19 NOTE — Progress Notes (Signed)
Report given to [place] at [facility], all questions answered. Awaiting [facility] to transport patient and will continue to monitor patient.

## 2019-08-19 NOTE — Discharge Summary (Signed)
Physician Discharge Summary  Clinton Davis [id number] DOB: [birth year] DOA: 08/16/2019  PCP: Seward Carol, MD  Admit date: 08/16/2019 Discharge date: 08/19/2019   Transfer Summary Transferring to [facility] for resection of left thigh mass, further assessment of intractable low back and right groin pain  Accepting physician: Dr. Magda Bernheim, orthopedics  Recommendations for Follow-up:   MRI abnormalities of the SI joint, right sacrum, adjacent iliac bone, iliacus muscle, right posterior paraspinal muscles adjacent to right SI joint, right side of spinous processes of lumbar spin  When patient undergoes mass resection, consider sampling inguinal node versus bone marrow aspiration to send for culture.  Discharge Diagnoses: Principal diagnosis is #1 1. Right lumbar sacral, pelvic, ischial, leg pain 2. MRI abnormalities of the SI joint, right sacrum, adjacent iliac bone, iliacus muscle, right posterior paraspinal muscles adjacent to right SI joint, right side of spinous processes of lumbar spin 3. Fever 4. 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 5. Four right lung nodules, single small left lung nodule seen on chest CT 6. Anemia of chronic disease   Discharge Condition: stable Disposition: transfer to [facility]  Diet recommendation: Regular  Filed Weights   08/17/19 0849 08/18/19 0501 08/19/19 0010  Weight: 83.1 kg 83.6 kg 83.6 kg    History of present illness:  38 year old man PMH recent diagnosis spindle cell neoplasm left thigh, presented with a right back, hip pelvis and leg pain for 2 weeks.  Difficulty walking.  Reported fever at home.  Currently undergoing outpatient evaluation for left thigh mass.  Admitted for intractable right lumbosacral, pelvic and leg pain.    Hospital Course:  Patient continued to have significant right-sided pain, underwent further evaluation with MRI  which showed nonspecific findings and the lumbar sacral area but now definite evidence of infection.  Patient was seen by infectious disease who felt that these findings were not related to infection.  Recommendation was to hold antibiotics and consider tissue diagnosis.  Patient continued to have intractable pain requiring Toradol, tramadol, oxycodone and hydromorphone.  He has been followed closely by Dr. Redmond Pulling at [facility] who requested transfer to his facility for surgical management of left thigh mass as well as further evaluation and treatment of pain and nonspecific MRI findings.  This was discussed in detail with wife and patient at bedside who agreed with transfer, all questions answered as best able  Right lumbar sacral, pelvic, ischial, leg pain.  Plain films of the hip and CT pelvis were negative.  Lower extremity DVT study was negative. --Mobility impaired by pain --MRI was concerning for osteomyelitis or septic arthritis the SI joint, right sacrum and adjacent iliac bone.  Patient referred for aspiration but there was no drainable collection seen. --Infectious disease reviewed films with radiology and upon further thought, it does not appear to be consistent with osteomyelitis. --consider PET scan and tissue biopsy, lymph node biopsy, possible bone marrow aspiration to see whether findings on imaging are related to spindle cell sarcoma versus another process. --Infectious disease recommended against antibiotics.  Did recommend placing an discharge summary that when patient undergoes mass resection, consider sampling inguinal node versus bone marrow aspiration to send for culture. --Follow-up with oncology as outpatient  Fever.  Also reported intermittent fever since May.  Covid negative. --Thought related to tumor. --Follow-up blood cultures, no growth thus far --Afebrile greater than 72 hours  Left anterior lateral intramuscular mass pathology report: Spindle cell neoplasm with  features most  consistent with rhabdo myoblastic tumor. --Has appointment scheduled with Dr. Alen Blew October 30--this was canceled.  Four right lung nodules, single small left lung nodule seen on chest CT October 6, findings concerning for metastatic disease.  Anemia of chronic disease --Stable.  Today's assessment: S: Severe pain on right groin, particularly enlarged of note, very difficult to move in bed, pain 9/10 but somewhat relieved with ketorolac.  Able to transfer to commode with difficulty. O: Vitals:  Vitals:   08/19/19 0408 08/19/19 0854  BP: 111/66 121/67  Pulse: 73 89  Resp: 16 20  Temp: 98.4 F (36.9 C) 100 F (37.8 C)  SpO2: 95% 100%    Constitutional:   Appears calm, uncomfortable, nontoxic ENMT:   grossly normal hearing  Respiratory:   CTA bilaterally, no w/r/r.   Respiratory effort normal.  Cardiovascular:   RRR, no m/r/g  No LE extremity edema   Abdomen:   Soft, nontender Musculoskeletal:   Large left thigh mass grossly visible Skin:   Tender lymphadenopathy right groin  Skin over groin, low back, right buttock appears unremarkable Psychiatric:   Mental status ? Mood, affect appropriate  judgment and insight appear intact    Today's Data   CMP unremarkable  Hemoglobin stable at 8.3  WBC trending down, 14.5  Discharge Instructions   Allergies as of 08/19/2019   No Known Allergies     Medication List    STOP taking these medications   aspirin EC 325 MG tablet   etodolac 400 MG tablet Commonly known as: LODINE   rizatriptan 10 MG tablet Commonly known as: MAXALT   tadalafil 10 MG tablet Commonly known as: CIALIS   traMADol 50 MG tablet Commonly known as: ULTRAM     TAKE these medications   nortriptyline 10 MG capsule Commonly known as: PAMELOR Take 10 mg by mouth at bedtime.      Current medications include Tylenol 650 mg every 6 hours as needed pain, enoxaparin 40 mg subcutaneous daily, hydromorphone 1  mg IV every 3 hours as needed breakthrough pain, oxycodone 10-20 mg every 4 hours as needed moderate to severe pain  No Known Allergies  The results of significant diagnostics from this hospitalization (including imaging, microbiology, ancillary and laboratory) are listed below for reference.    Significant Diagnostic Studies: Dg Chest 2 View  Result Date: 08/16/2019 CLINICAL DATA:  Right hip pain, weakness, fever EXAM: CHEST - 2 VIEW COMPARISON:  Chest radiograph, 06/08/2019, CT chest, 07/28/2019 FINDINGS: The heart size and mediastinal contours are within normal limits. Rounded nodules in the right lung appear substantially enlarged compared to CT dated 07/28/2019 and are new in comparison to chest radiographs dated 06/08/2019. The visualized skeletal structures are unremarkable. IMPRESSION: 1.  No acute abnormality of the lungs. 2. Rounded nodules in the right lung appear substantially enlarged compared to CT dated 07/28/2019 and are new in comparison to chest radiographs dated 06/08/2019, concerning for enlarging metastatic nodules. Electronically Signed   By: Eddie Candle M.D.   On: 08/16/2019 13:15   Dg Lumbar Spine Complete  Result Date: 08/16/2019 CLINICAL DATA:  Low back pain with right-sided radicular symptoms EXAM: LUMBAR SPINE - COMPLETE 4+ VIEW COMPARISON:  None. FINDINGS: Frontal, lateral, spot lumbosacral lateral, and bilateral oblique views were obtained. There are 5 non-rib-bearing lumbar type vertebral bodies. There is no fracture or spondylolisthesis. There is moderate narrowing at L5-S1. Other disc spaces appear unremarkable. There is no appreciable facet arthropathy. IMPRESSION: Disc space narrowing at L5-S1. Other disc spaces appear  unremarkable. No appreciable arthropathy. No fracture or spondylolisthesis. Electronically Signed   By: Lowella Grip III M.D.   On: 08/16/2019 10:30   Dg Shoulder Right  Result Date: 08/16/2019 CLINICAL DATA:  Pain EXAM: RIGHT SHOULDER - 2+  VIEW COMPARISON:  Chest CT July 28, 2019 FINDINGS: Oblique and Y scapular images were obtained. No evident fracture or dislocation. Joint spaces appear normal. No erosive change or intra-articular calcification. Nodular opacity right upper lobe documented on recent CT is apparent by radiography. IMPRESSION: No fracture or dislocation. No evident arthropathy. Nodular opacity right upper lobe, documented on recent chest CT. Electronically Signed   By: Lowella Grip III M.D.   On: 08/16/2019 10:32   Ct Chest W Contrast  Result Date: 07/28/2019 CLINICAL DATA:  Anemia with 20 pound weight loss over the past several months as well as cough. EXAM: CT CHEST WITH CONTRAST TECHNIQUE: Multidetector CT imaging of the chest was performed during intravenous contrast administration. CONTRAST:  56mL ISOVUE-300 IOPAMIDOL (ISOVUE-300) INJECTION 61% COMPARISON:  CT abdomen/pelvis 06/11/2019 FINDINGS: Cardiovascular: Borderline cardiomegaly. Thoracic aorta is normal. Pulmonary arterial system is within normal. Remaining vascular structures are normal. Mediastinum/Nodes: No evidence of mediastinal or hilar adenopathy. Remaining mediastinal structures are normal. Lungs/Pleura: Lungs are adequately inflated without focal airspace consolidation or effusion. There are several bilateral lung nodules with the largest over the posterior right upper lobe abutting the pleura measuring 1.2 x 1.4 cm. Total of 4 nodules within the right lung and a single 2 mm nodule over the left lung in the lingula. These nodules are predominantly well-defined rounded. Airways are normal. Upper Abdomen: No acute findings. No evidence of free fluid or adenopathy. Musculoskeletal: Minimal degenerative change of the spine. IMPRESSION: 1.  No acute cardiopulmonary disease. 2. Four right lung nodules and single small left lung nodule. Largest nodule measures 1.4 cm in greatest diameter over the posterior right upper lobe. Findings are concerning for  metastatic disease in light of patient's clinical history and known large left thigh mass seen on previous CT which may represent primary sarcomatous lesion. Recommend clinical correlation. Electronically Signed   By: Marin Olp M.D.   On: 07/28/2019 13:39   Ct Pelvis W Contrast  Result Date: 08/16/2019 CLINICAL DATA:  Right gluteal pain for several days. No reported injury. Clinical concern for myositis or abscess. EXAM: CT PELVIS WITH CONTRAST TECHNIQUE: Multidetector CT imaging of the pelvis was performed using the standard protocol following the bolus administration of intravenous contrast. CONTRAST:  19mL OMNIPAQUE IOHEXOL 300 MG/ML  SOLN COMPARISON:  08/16/2019 pelvic and right hip radiographs. FINDINGS: Urinary Tract:  Normal bladder. Normal caliber pelvic ureters. Bowel:  Normal caliber pelvic bowel loops. No bowel wall thickening. Vascular/Lymphatic: Mild left iliac atherosclerosis. No acute vascular abnormality. No pathologically enlarged pelvic lymph nodes. Reproductive:  Normal size prostate. Other: No pelvic ascites, pneumoperitoneum or focal fluid collection. Tiny umbilical fat containing hernia. Musculoskeletal: No aggressive appearing focal osseous lesions. There is a large 11.6 x 7.4 x 18.8 cm intramuscular mass in the anterolateral proximal left thigh (series 3/image 63) with irregular poorly defined margins and markedly heterogeneous internal soft tissue density with hypodense and hyperdense components, the inferior extent of which is not visualized on this CT. No periosteal reaction or erosive changes in the adjacent proximal left femur. No gluteal region mass or fluid collection. IMPRESSION: 1. Large 11.6 x 7.4 x 18.8 cm intramuscular mass in the anterolateral proximal left thigh, with irregular poorly defined margins and markedly heterogeneous internal soft tissue density  with hypodense and hyperdense components. Soft tissue sarcoma is the diagnosis of exclusion. MRI of the left thigh  without and with IV contrast is strongly recommended for further characterization. 2. No pelvic lymphadenopathy. Electronically Signed   By: Ilona Sorrel M.D.   On: 08/16/2019 14:30   Mr Lumbar Spine W Wo Contrast  Result Date: 08/17/2019 CLINICAL DATA:  Low back pain and right hip and leg pain. Buttock and posterior thigh pain. 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 Right inguinal pain. Radiculopathy. EXAM: MRI LUMBAR SPINE WITHOUT AND WITH CONTRAST TECHNIQUE: Multiplanar and multiecho pulse sequences of the lumbar spine were obtained without and with intravenous contrast. CONTRAST:  23mL GADAVIST GADOBUTROL 1 MMOL/ML IV SOLN COMPARISON:  CT scan of the pelvis dated 08/16/2019 and lumbar radiographs dated 08/06/2019 FINDINGS: Segmentation:  Standard. Alignment:  Physiologic. Vertebrae: There is abnormal very low signal intensity from the vertebra and visualized portion of the sacrum and iliac bones. This could be due to reactivated red marrow or fibrosis. The patient is anemic. There is abnormal edema in the right sacral ala and in the soft tissues around the superior aspect of the right SI joint. There is a small fluid collection anterior to the right SI joint seen on images 44 of series 9 and series 6. There is edema in the deep fibers of the right iliacus muscle anterior to the psoas muscle. There is edema and abnormal enhancement in the right posterior paraspinal muscles adjacent to the SI joint and extending superiorly in the lumbar spine to the L3 level along the right side of the spinous processes. After contrast administration there is enhancement of the soft tissues around the right SI joint. There is no discrete fracture or mass at that site.The patient does have an elevated white blood count. The possibility of infectious right sacroiliitis should be considered. Conus medullaris and cauda equina: Conus extends to the L1-2 level. Conus and cauda equina appear normal. Paraspinal and other  soft tissues: Abnormal edema and enhancement in the right paraspinal soft tissues as described above as well as around the right SI joint. The possibility of infectious right sacroiliitis and myositis and osteomyelitis should be considered. The appearance is not suggestive of metastatic disease or fracture. Disc levels: The discs from T11-12 through L5-S1 are normal. There is no abnormal enhancement within the spinal canal. No evidence of epidural abscess. IMPRESSION: 1. Abnormal edema and enhancement in the right sacral ala and around the right SI joint. The possibility of infectious right sacroiliitis should be considered. 2. Abnormal edema and enhancement in the deep fibers of the right iliacus muscle adjacent to the right psoas muscle. This could be due to inflammation or infection. 3. No evidence of epidural abscess or epidural abscess. 4. Abnormal edema and enhancement in the right posterior paraspinal muscles adjacent to the right SI joint and right side of the spinous processes of the lower lumbar spine extending to the L3 level. I suspect this is secondary to infection. 5. Abnormal low signal intensity of the vertebra and iliac bones which could be due to reactivated red marrow or fibrosis. 6. Critical Value/emergent results were called by telephone at the time of interpretation on 08/17/2019 at 5:06 pm to providerDAVID TAT , who verbally acknowledged these results. Electronically Signed   By: Lorriane Shire M.D.   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 Right  sided pelvic and inguinal pain. EXAM: MRI PELVIS WITHOUT AND WITH CONTRAST TECHNIQUE: Multiplanar multisequence MR imaging of the pelvis was performed both before and after administration of intravenous contrast. CONTRAST:  90mL GADAVIST GADOBUTROL 1 MMOL/ML IV SOLN COMPARISON:  CT scan 08/16/2019 FINDINGS: Urinary Tract:  The bladder is  unremarkable. Bowel:  The visualized small bowel and colon are unremarkable. Vascular/Lymphatic: Borderline bilateral inguinal lymph nodes. Reproductive: The prostate gland and seminal vesicles are unremarkable. Other:  Small amount of presacral fluid is noted. Musculoskeletal: Diffuse abnormal T1 and T2 signal intensity in the left iliac bone anteriorly and subsequent minimal contrast enhancement. There is significant surrounding inflammatory changes involving the iliacus muscle and gluteus minimus muscle. Findings suspicious for osteomyelitis and adjacent myositis. Metastatic disease/infiltrating tumor would be another possibility. Fluid noted in the right SI joint but no significant widening or obvious destructive changes. Diffuse abnormal T1 and T2 signal intensity in the right side of the sacrum and subsequent contrast enhancement. There is also some signal abnormality in the adjacent iliac bone anteriorly and a more rounded appearing lesion in the iliac bone posteriorly. There is adjacent significant inflammatory/interstitial changes involving the right iliopsoas complex along with a possible small, 8 mm abscess along the anterior aspect of the right SI joint. There is also significant inflammation of the piriformis muscle on the right side and some fluid/edema and inflammation surrounding the operator internus muscle. The low right paravertebral musculature also demonstrate inflammatory changes. I do not see any findings suspicious for discitis or osteomyelitis in the lower lumbar spine. A 10 cm heterogeneously enhancing somewhat necrotic appearing left anterior thigh mass is again demonstrated. IMPRESSION: 1. 10 cm left anterior thigh mass, known spindle-cell sarcoma. 2. Areas of significant osseous and muscular abnormality involving the pelvis as detailed above. I think the findings are much more characteristic of osteomyelitis and septic arthritis than metastatic disease. No discrete drainable abscess is  identified. Electronically Signed   By: Marijo Sanes M.D.   On: 08/18/2019 09:36   Dg Hip Unilat W Or Wo Pelvis 2-3 Views Right  Result Date: 08/16/2019 CLINICAL DATA:  Progressive pain EXAM: DG HIP (WITH OR WITHOUT PELVIS) 2-3V RIGHT COMPARISON:  None. FINDINGS: Frontal pelvis as well as frontal and lateral right hip images were obtained. No fracture or dislocation. There is slight narrowing of each hip joint. There is mild bony overgrowth along each lateral acetabulum. No erosive changes. Sacroiliac joints appear unremarkable bilaterally. A small bone island is noted in the right femoral neck. IMPRESSION: No fracture or dislocation. Symmetric bony overgrowth along each lateral acetabulum. This finding places patient at increased risk for femoroacetabular syndrome. There is mild symmetric narrowing of each hip joint. Electronically Signed   By: Lowella Grip III M.D.   On: 08/16/2019 10:29   Vas [place] Lower Extremity Venous (dvt) (only [facility])  Result Date: 08/16/2019  Lower Venous Study Indications: Buttock pain radiating down leg.  Comparison Study: no prior Performing Technologist: June Leap RDMS, RVT  Examination Guidelines: A complete evaluation includes B-mode imaging, spectral Doppler, color Doppler, and power Doppler as needed of all accessible portions of each vessel. Bilateral testing is considered an integral part of a complete examination. Limited examinations for reoccurring indications may be performed as noted.  +---------+---------------+---------+-----------+----------+--------------+  RIGHT     Compressibility Phasicity Spontaneity Properties Thrombus Aging  +---------+---------------+---------+-----------+----------+--------------+  CFV       Full            Yes  Yes                                    +---------+---------------+---------+-----------+----------+--------------+  SFJ       Full                                                              +---------+---------------+---------+-----------+----------+--------------+  FV Prox   Full                                                             +---------+---------------+---------+-----------+----------+--------------+  FV Mid    Full                                                             +---------+---------------+---------+-----------+----------+--------------+  FV Distal Full                                                             +---------+---------------+---------+-----------+----------+--------------+  PFV       Full                                                             +---------+---------------+---------+-----------+----------+--------------+  POP       Full            Yes       Yes                                    +---------+---------------+---------+-----------+----------+--------------+  PTV       Full                                                             +---------+---------------+---------+-----------+----------+--------------+  PERO      Full                                                             +---------+---------------+---------+-----------+----------+--------------+     Summary: Right: There is no evidence of deep vein thrombosis in the lower extremity. No  cystic structure found in the popliteal fossa.  *See table(s) above for measurements and observations. Electronically signed by Monica Martinez MD on 08/16/2019 at 5:05:37 PM.    Final     Microbiology: Recent Results (from the past 240 hour(s))  Culture, blood (routine x 2)     Status: None (Preliminary result)   Collection Time: 08/16/19  8:20 PM   Specimen: BLOOD  Result Value Ref Range Status   Specimen Description BLOOD LEFT ANTECUBITAL  Final   Special Requests   Final    BOTTLES DRAWN AEROBIC AND ANAEROBIC Blood Culture adequate volume   Culture   Final    NO GROWTH 3 DAYS Performed at [facility], [address] [place], [place] [address]    Report Status PENDING  Incomplete   Culture, blood (routine x 2)     Status: None (Preliminary result)   Collection Time: 08/16/19  8:25 PM   Specimen: BLOOD RIGHT HAND  Result Value Ref Range Status   Specimen Description BLOOD RIGHT HAND  Final   Special Requests   Final    BOTTLES DRAWN AEROBIC AND ANAEROBIC Blood Culture results may not be optimal due to an inadequate volume of blood received in culture bottles   Culture   Final    NO GROWTH 3 DAYS Performed at [facility], [place] [address], [place], [place] [address]    Report Status PENDING  Incomplete  SARS CORONAVIRUS 2 (TAT 6-24 HRS) Nasopharyngeal Nasopharyngeal Swab     Status: None   Collection Time: 08/16/19  9:04 PM   Specimen: Nasopharyngeal Swab  Result Value Ref Range Status   SARS Coronavirus 2 NEGATIVE NEGATIVE Final    Comment: (NOTE) SARS-CoV-2 target nucleic acids are NOT DETECTED. The SARS-CoV-2 RNA is generally detectable in upper and lower respiratory specimens during the acute phase of infection. Negative results do not preclude SARS-CoV-2 infection, do not rule out co-infections with other pathogens, and should not be used as the sole basis for treatment or other patient management decisions. Negative results must be combined with clinical observations, patient history, and epidemiological information. The expected result is Negative. Fact Sheet for Patients: SugarRoll.be Fact Sheet for Healthcare Providers: https://www.woods-mathews.com/ This test is not yet approved or cleared by the [place] FDA and  has been authorized for detection and/or diagnosis of SARS-CoV-2 by FDA under an Emergency Use Authorization (EUA). This EUA will remain  in effect (meaning this test can be used) for the duration of the COVID-19 declaration under Section 56 4(b)(1) of the Act, 21 U.S.C. section 360bbb-3(b)(1), unless the authorization is terminated or revoked sooner. Performed at [facility], [place] [address], [place], [place] [address]   Culture, blood (routine x 2)     Status: None (Preliminary result)   Collection Time: 08/18/19  2:10 PM   Specimen: BLOOD RIGHT HAND  Result Value Ref Range Status   Specimen Description BLOOD RIGHT HAND  Final   Special Requests   Final    BOTTLES DRAWN AEROBIC AND ANAEROBIC Blood Culture adequate volume   Culture   Final    NO GROWTH < 24 HOURS Performed at [facility], [place] [address], [place], [place] [address]    Report Status PENDING  Incomplete  Culture, blood (routine x 2)     Status: None (Preliminary result)   Collection Time: 08/18/19  2:15 PM   Specimen: BLOOD  Result Value Ref Range Status   Specimen Description BLOOD  LEFT ANTECUBITAL  Final   Special Requests   Final    BOTTLES DRAWN AEROBIC AND ANAEROBIC Blood Culture adequate volume   Culture   Final    NO GROWTH < 24 HOURS Performed at [facility], [address] [place], [place] [address]    Report Status PENDING  Incomplete     Labs: Basic Metabolic Panel: Recent Labs  Lab 08/16/19 1230 08/17/19 1223 08/18/19 0455 08/19/19 0337  NA 135 139 139 134*  K 4.4 4.3 4.5 4.4  CL 99 104 103 100  CO2 24 25 26 25   GLUCOSE 103* 133* 102* 103*  BUN 10 10 13 13   CREATININE 0.84 0.99 0.94 0.87  CALCIUM 9.1 8.8* 8.9 8.6*   Liver Function Tests: Recent Labs  Lab 08/16/19 1230 08/19/19 0337  AST 23 21  ALT 48* 39  ALKPHOS 213* 239*  BILITOT 0.7 0.5  PROT 7.6 6.4*  ALBUMIN 2.6* 2.2*   CBC: Recent Labs  Lab 08/16/19 1230 08/16/19 2020 08/17/19 1223 08/18/19 0455 08/19/19 0337  WBC 20.7*  --  17.2* 15.6* 14.5*  NEUTROABS 17.0*  --   --   --   --   HGB 9.4*  --  8.2* 8.5* 8.3*  HCT 31.1* 28.2* 27.0* 28.4* 27.4*  MCV 80.6  --  79.6* 81.1 80.1  PLT 508*  --  454* 491* 481*    Recent Labs  Lab 08/18/19 0455  CKTOTAL 30*    Principal Problem:   Right hip pain Active Problems:   Fever   Sacroiliitis (HCC)   Sarcoma (HCC)   Lung  nodule   Anemia of chronic disease   Time coordinating discharge: 90 minutes  Signed:  [place] Hodgkins, MD  [facility]  08/19/2019, 1:21 PM

## 2019-08-19 NOTE — Progress Notes (Signed)
Transport here to get pt.

## 2019-08-21 ENCOUNTER — Inpatient Hospital Stay: Payer: BC Managed Care – PPO | Attending: Oncology | Admitting: Oncology

## 2019-08-21 LAB — CULTURE, BLOOD (ROUTINE X 2)
Culture: NO GROWTH
Culture: NO GROWTH
Special Requests: ADEQUATE

## 2019-08-23 LAB — CULTURE, BLOOD (ROUTINE X 2)
Culture: NO GROWTH
Culture: NO GROWTH
Special Requests: ADEQUATE
Special Requests: ADEQUATE

## 2019-08-24 LAB — LYME DISEASE, WESTERN BLOT
IgG P18 Ab.: ABSENT
IgG P23 Ab.: ABSENT
IgG P28 Ab.: ABSENT
IgG P30 Ab.: ABSENT
IgG P39 Ab.: ABSENT
IgG P41 Ab.: ABSENT
IgG P45 Ab.: ABSENT
IgG P58 Ab.: ABSENT
IgG P66 Ab.: ABSENT
IgG P93 Ab.: ABSENT
IgM P39 Ab.: ABSENT
IgM P41 Ab.: ABSENT
Lyme IgG Wb: NEGATIVE
Lyme IgM Wb: NEGATIVE

## 2019-11-23 DEATH — deceased

## 2021-05-15 IMAGING — CR DG SHOULDER 2+V*R*
2 series · 2 of 2 positions shown · non-contrast
Comparison: Chest CT July 28, 2019

CLINICAL DATA: Pain

EXAM:
RIGHT SHOULDER - 2+ VIEW

[shoulder grashey]
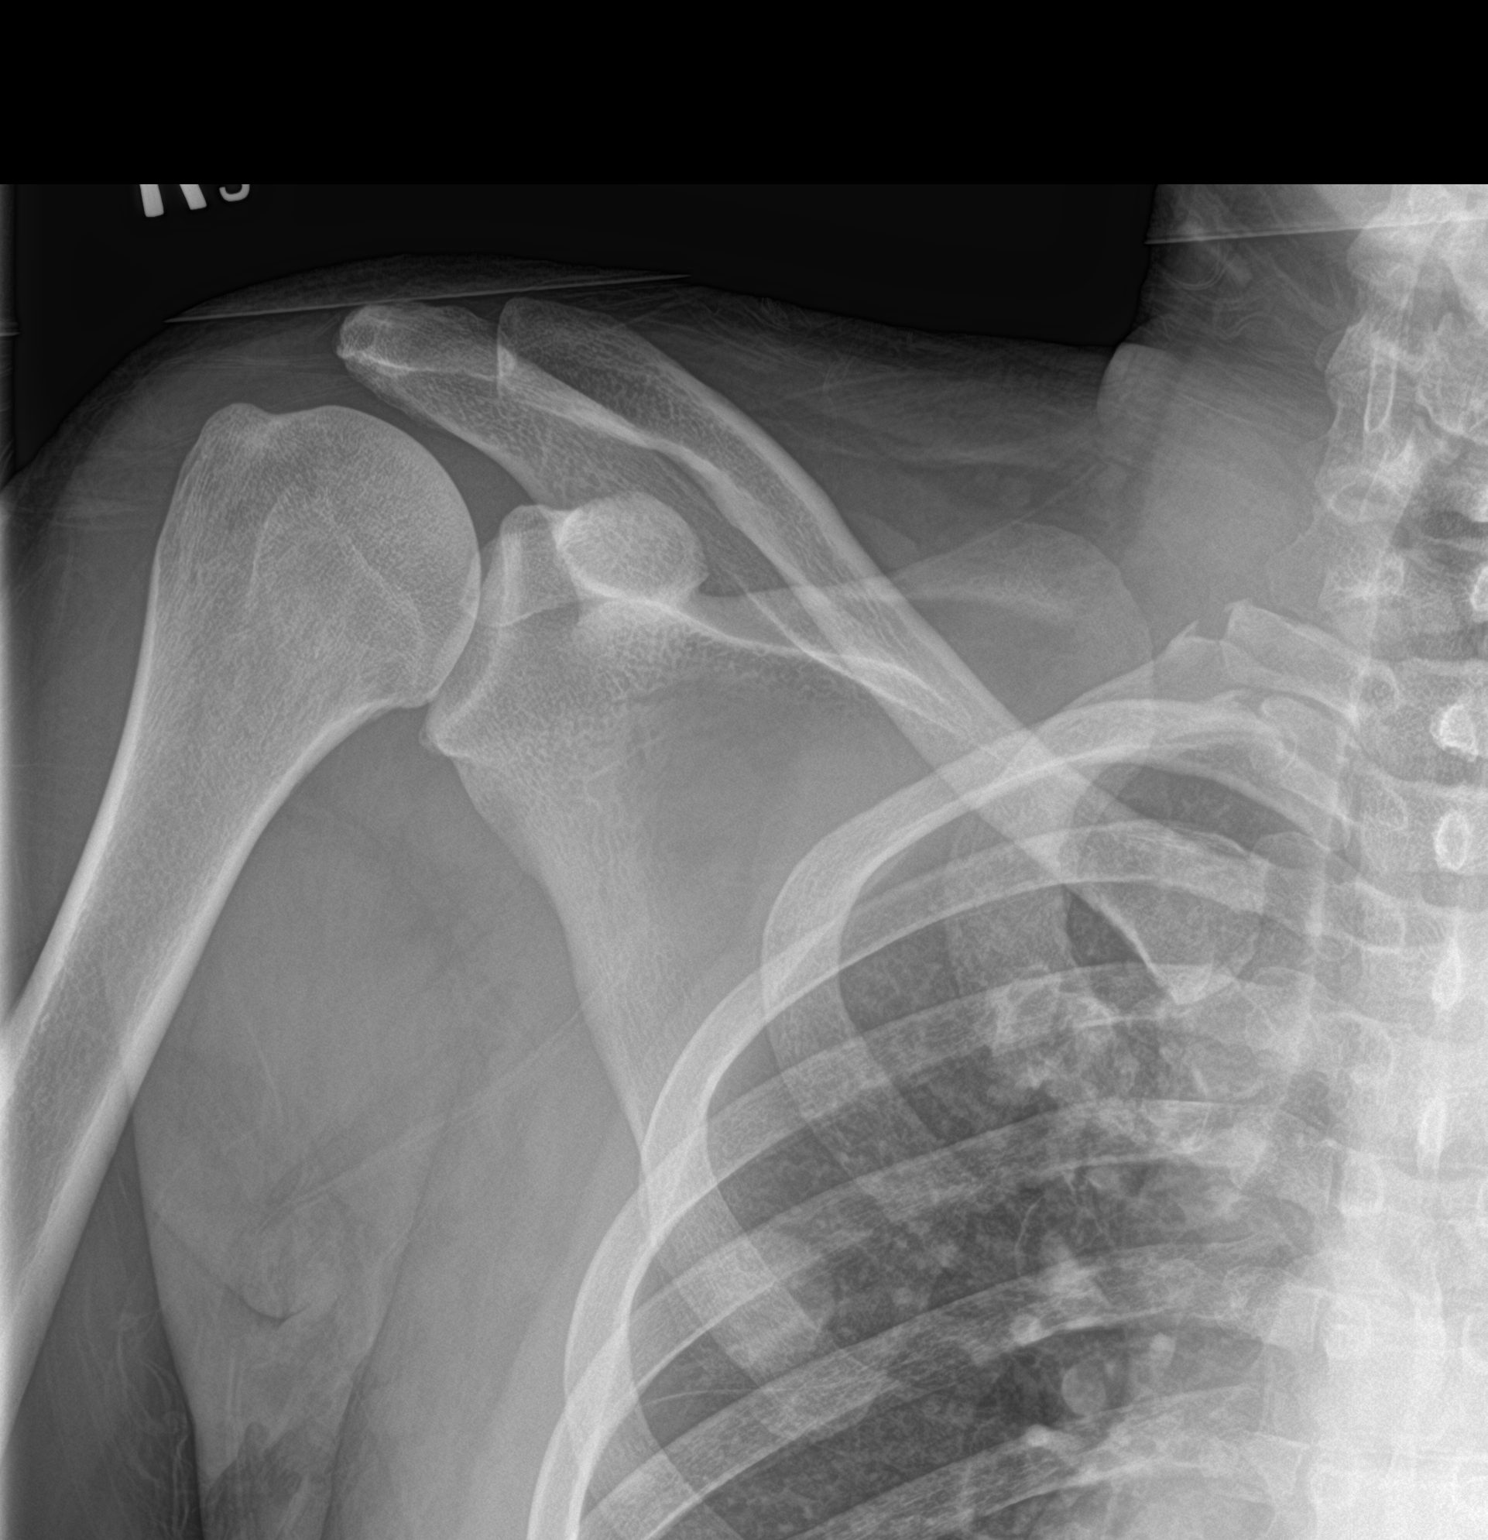

[shoulder y view]
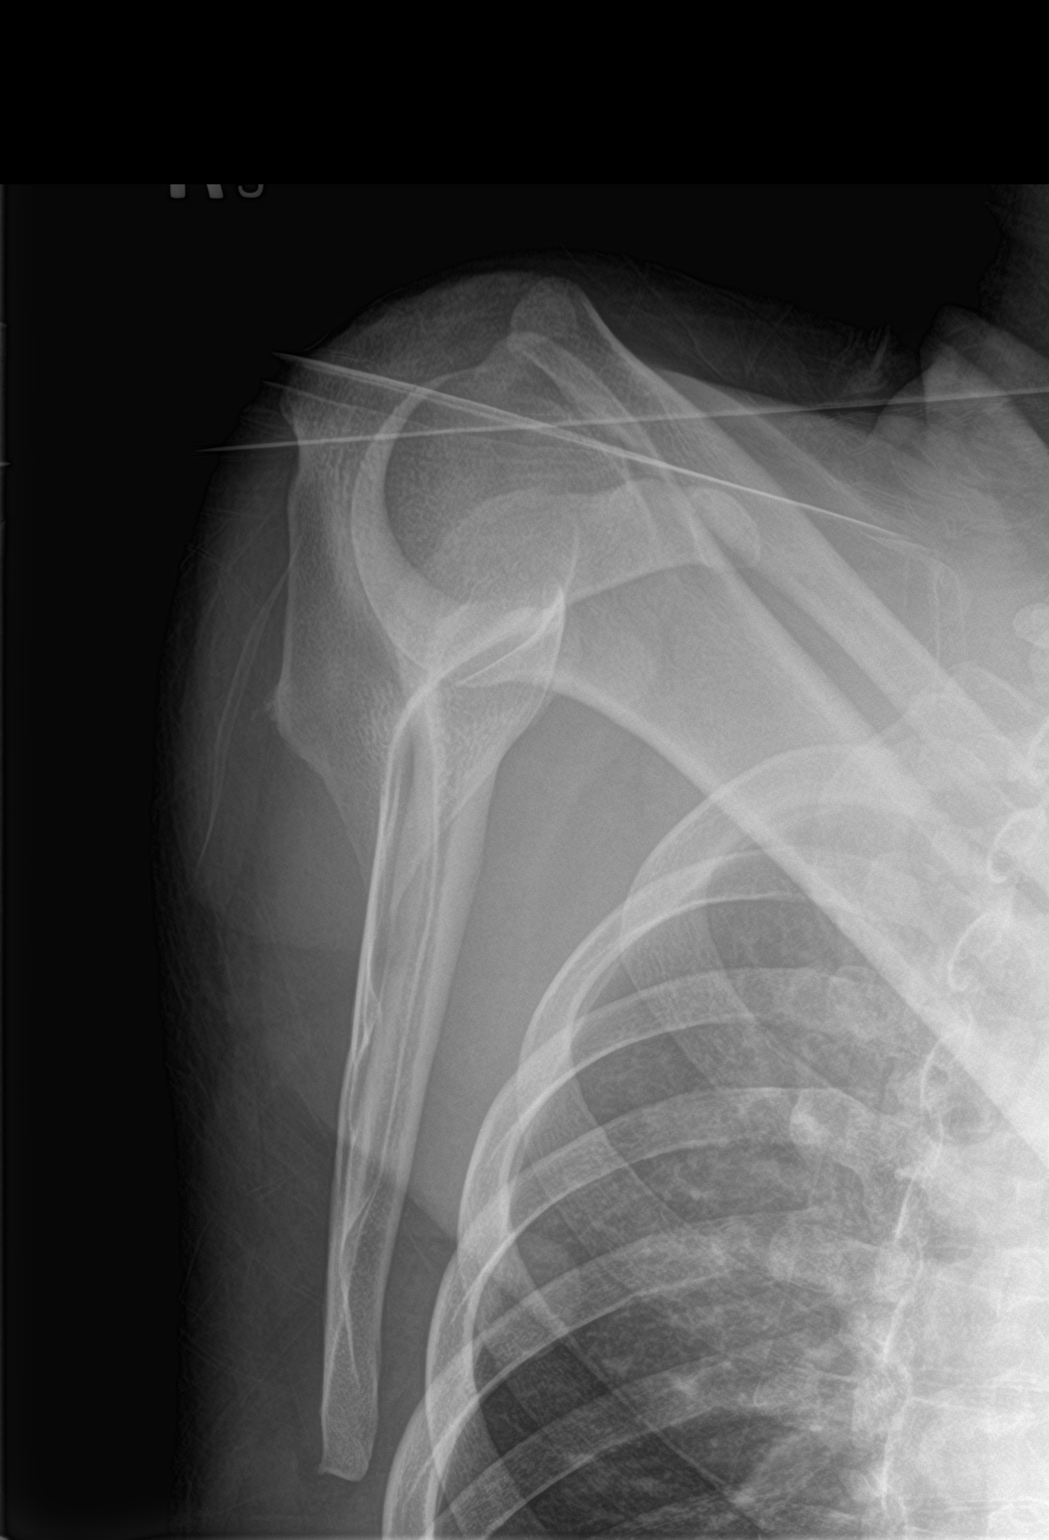

[2 of 2 positions shown; findings below may reference images not displayed]

FINDINGS: Oblique and Y scapular images were obtained. No evident fracture or
dislocation. Joint spaces appear normal. No erosive change or
intra-articular calcification. Nodular opacity right upper lobe
documented on recent CT is apparent by radiography.
IMPRESSION: No fracture or dislocation. No evident arthropathy. Nodular opacity
right upper lobe, documented on recent chest CT.

## 2021-05-15 IMAGING — CT CT PELVIS W/ CM
2 of 4 series · 17 of 46 positions shown, 19 images · IV contrast (APPLIED)
Comparison: 08/16/2019 pelvic and right hip radiographs.

CLINICAL DATA: Right gluteal pain for several days. No reported
injury. Clinical concern for myositis or abscess.

EXAM:
CT PELVIS WITH CONTRAST
TECHNIQUE: Multidetector CT imaging of the pelvis was performed using the
standard protocol following the bolus administration of intravenous
contrast.
CONTRAST:  100mL OMNIPAQUE IOHEXOL 300 MG/ML  SOLN

[Series 3: pelvis 5.0 i30f 2 · axial · 0.98mm/px · z∈[+682,+1042]mm · 14 of 80 slices shown, 16 images]
[im 4/80  soft-tissue]
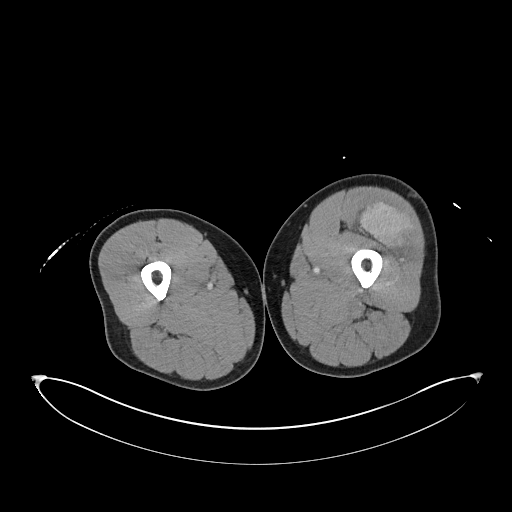
[im 4/80  bone]
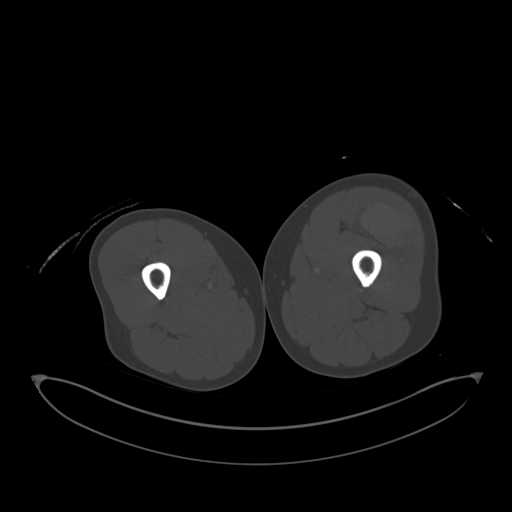
[im 10/80  soft-tissue]
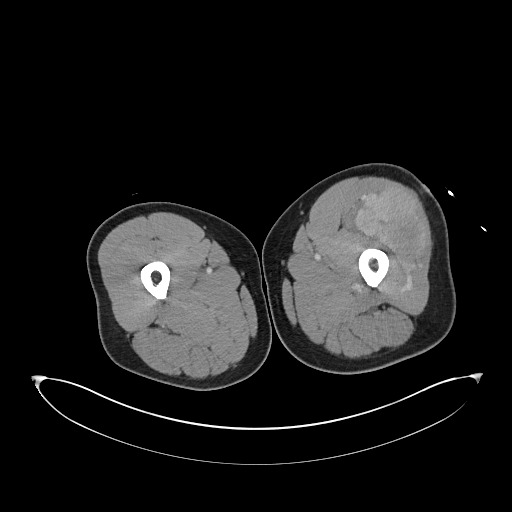
[im 16/80  soft-tissue]
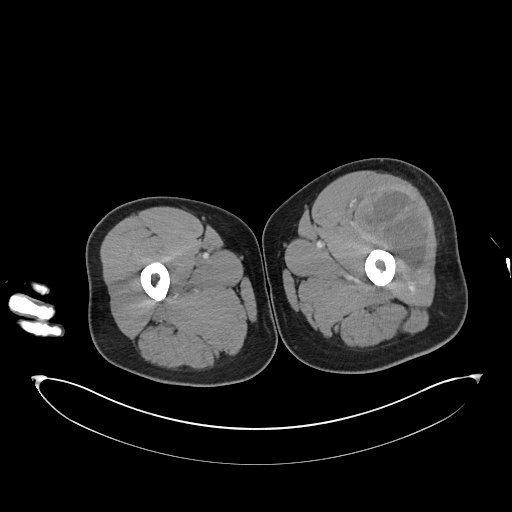
[im 23/80  soft-tissue]
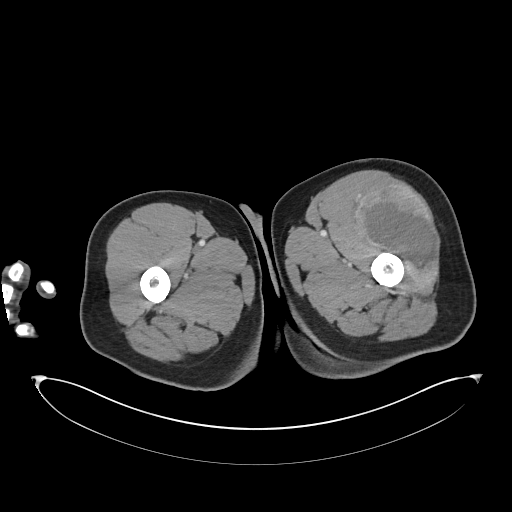
[im 26/80  soft-tissue]
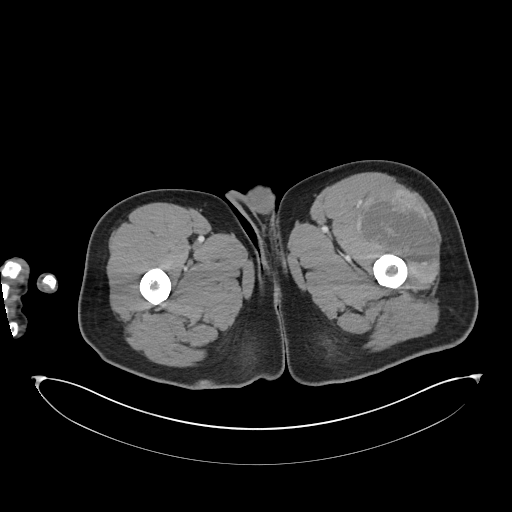
[im 32/80  soft-tissue]
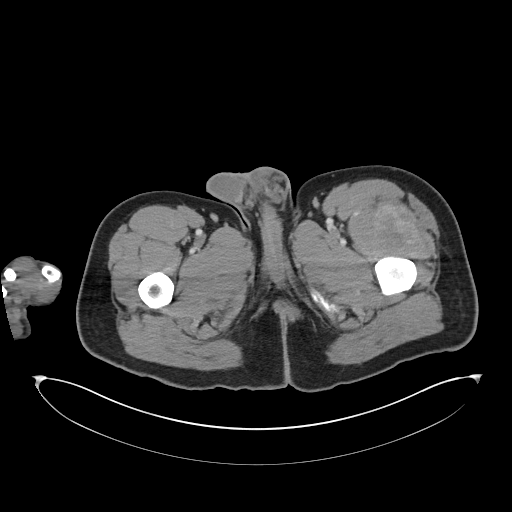
[im 38/80  soft-tissue]
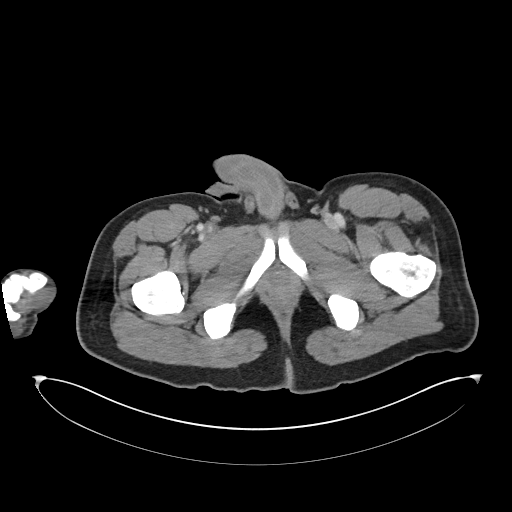
[im 42/80  soft-tissue]
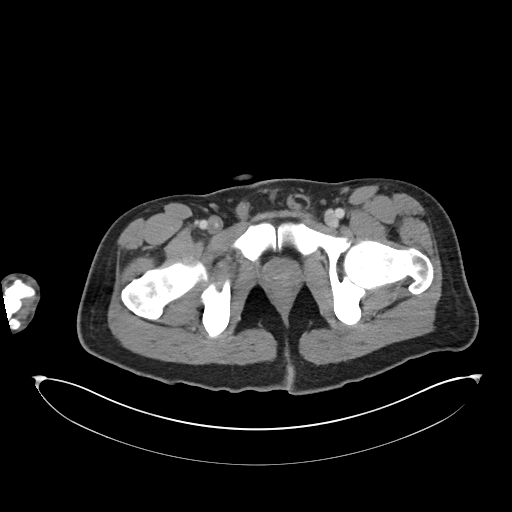
[im 48/80  soft-tissue]
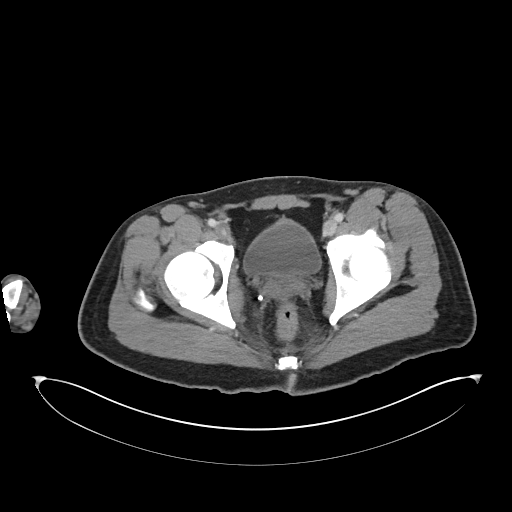
[im 48/80  bone]
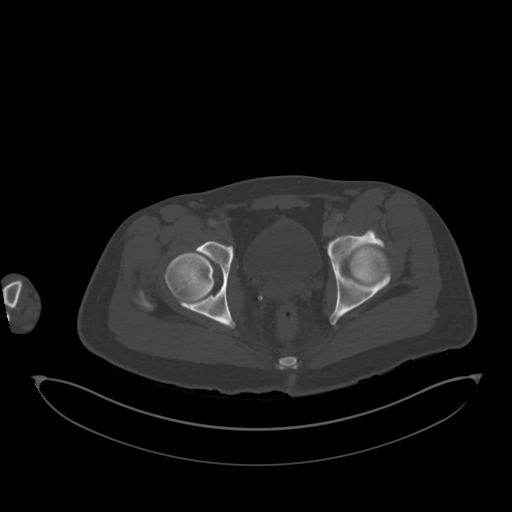
[im 54/80  soft-tissue]
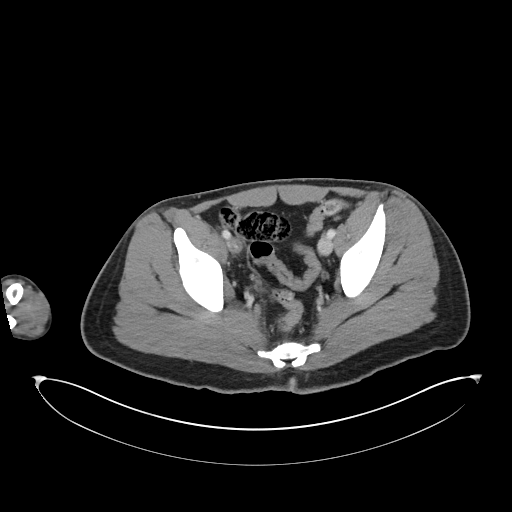
[im 61/80  soft-tissue]
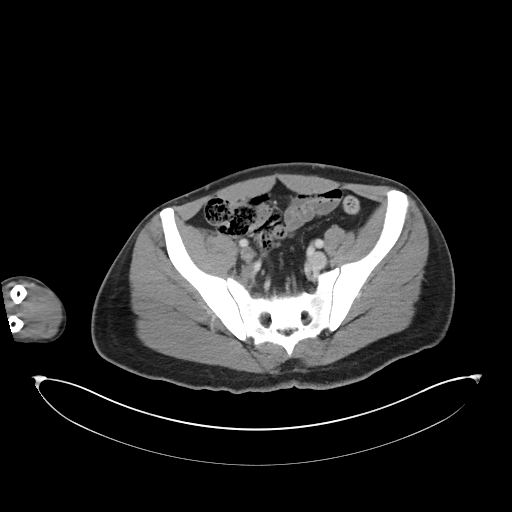
[im 64/80  soft-tissue]
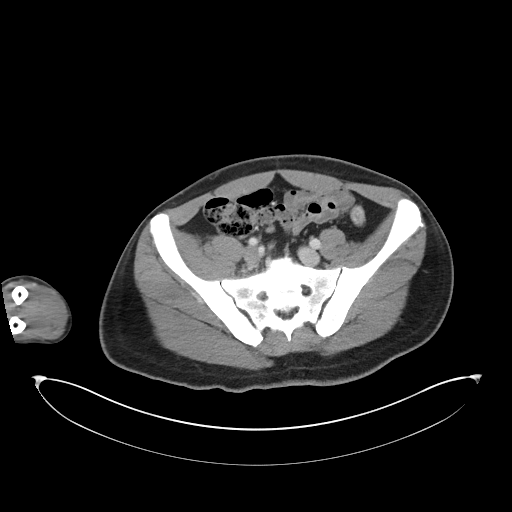
[im 70/80  soft-tissue]
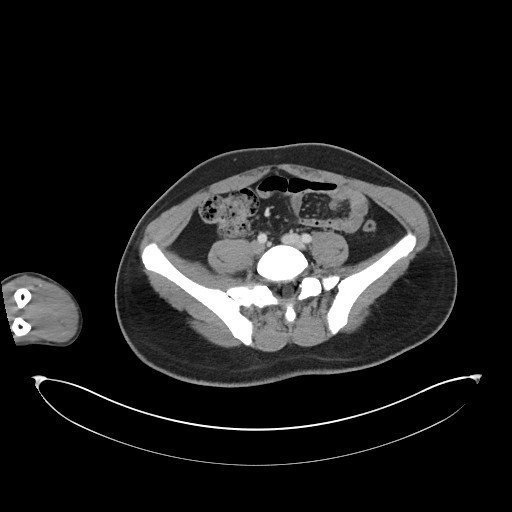
[im 76/80  soft-tissue]
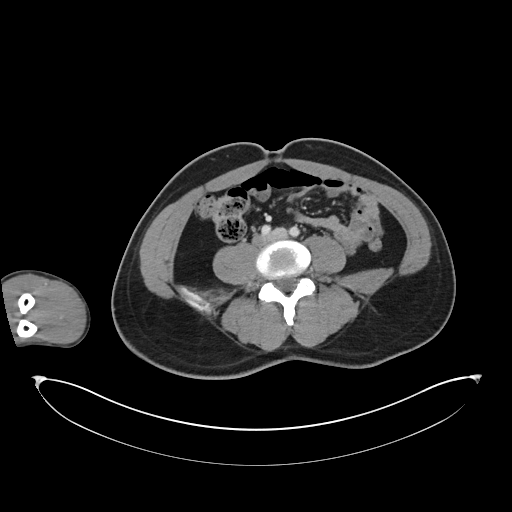

[Series 6: coronal soft tissue · coronal · 0.84mm/px · 3 of 100 slices shown]
[im 34/100  soft-tissue]
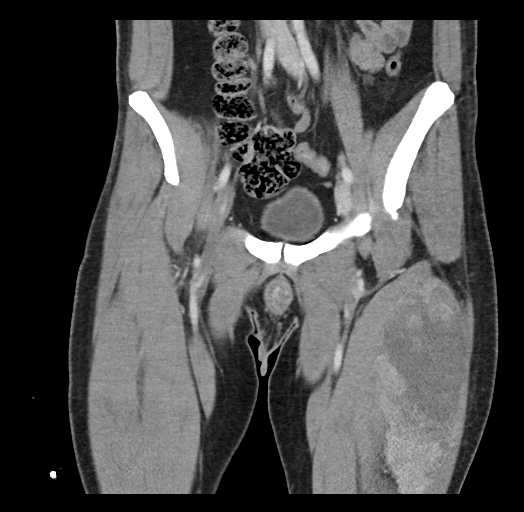
[im 45/100  soft-tissue]
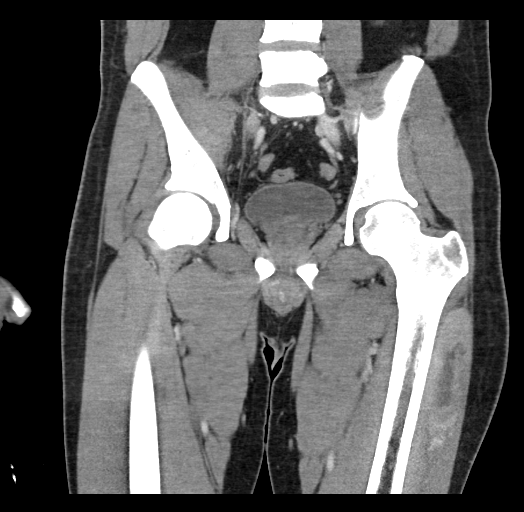
[im 56/100  soft-tissue]
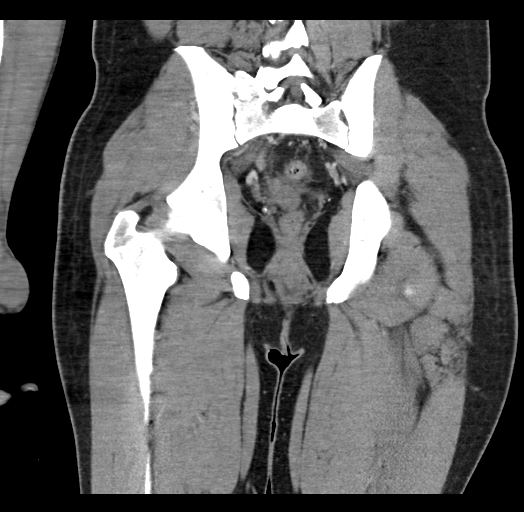

[17 of 46 positions shown; findings below may reference images not displayed]

FINDINGS: Urinary Tract:  Normal bladder. Normal caliber pelvic ureters.

Bowel:  Normal caliber pelvic bowel loops. No bowel wall thickening.

Vascular/Lymphatic: Mild left iliac atherosclerosis. No acute
vascular abnormality. No pathologically enlarged pelvic lymph nodes.

Reproductive:  Normal size prostate.

Other: No pelvic ascites, pneumoperitoneum or focal fluid
collection. Tiny umbilical fat containing hernia.

Musculoskeletal: No aggressive appearing focal osseous lesions.
There is a large 11.6 x 7.4 x 18.8 cm intramuscular mass in the
anterolateral proximal left thigh (series 3/image 63) with irregular
poorly defined margins and markedly heterogeneous internal soft
tissue density with hypodense and hyperdense components, the
inferior extent of which is not visualized on this CT. No periosteal
reaction or erosive changes in the adjacent proximal left femur. No
gluteal region mass or fluid collection.
IMPRESSION: 1. Large 11.6 x 7.4 x 18.8 cm intramuscular mass in the
anterolateral proximal left thigh, with irregular poorly defined
margins and markedly heterogeneous internal soft tissue density with
hypodense and hyperdense components. Soft tissue sarcoma is the
diagnosis of exclusion. MRI of the left thigh without and with IV
contrast is strongly recommended for further characterization.
2. No pelvic lymphadenopathy.
